# Patient Record
Sex: Female | Born: 1960 | Race: Asian | Hispanic: No | Smoking: Never smoker
Health system: Southern US, Academic
[De-identification: ages and names within clinical notes are randomized; demographics above are authoritative.]

## PROBLEM LIST (undated history)

## (undated) DIAGNOSIS — L709 Acne, unspecified: Secondary | ICD-10-CM

## (undated) DIAGNOSIS — K3580 Unspecified acute appendicitis: Secondary | ICD-10-CM

## (undated) DIAGNOSIS — T8149XA Infection following a procedure, other surgical site, initial encounter: Secondary | ICD-10-CM

## (undated) DIAGNOSIS — I82409 Acute embolism and thrombosis of unspecified deep veins of unspecified lower extremity: Secondary | ICD-10-CM

## (undated) HISTORY — DX: Infection following a procedure, other surgical site, initial encounter: T81.49XA

## (undated) HISTORY — PX: ABDOMINAL HYSTERECTOMY: SHX81

## (undated) HISTORY — DX: Unspecified acute appendicitis: K35.80

## (undated) HISTORY — PX: HX BREAST BIOPSY: SHX20

---

## 2002-01-10 ENCOUNTER — Ambulatory Visit: Payer: Self-pay

## 2003-01-16 ENCOUNTER — Ambulatory Visit: Payer: Self-pay

## 2003-10-17 ENCOUNTER — Ambulatory Visit: Payer: Self-pay

## 2004-04-14 ENCOUNTER — Ambulatory Visit: Payer: Self-pay

## 2004-07-03 ENCOUNTER — Emergency Department: Payer: Self-pay | Admitting: Emergency Medicine

## 2005-04-18 ENCOUNTER — Ambulatory Visit: Payer: Self-pay

## 2006-04-20 ENCOUNTER — Ambulatory Visit: Payer: Self-pay

## 2006-04-25 ENCOUNTER — Ambulatory Visit: Payer: Self-pay

## 2006-12-18 ENCOUNTER — Ambulatory Visit: Payer: Self-pay | Admitting: Obstetrics and Gynecology

## 2007-03-13 ENCOUNTER — Ambulatory Visit: Payer: Self-pay | Admitting: Obstetrics and Gynecology

## 2007-03-19 ENCOUNTER — Inpatient Hospital Stay: Payer: Self-pay | Admitting: Obstetrics and Gynecology

## 2007-04-26 ENCOUNTER — Ambulatory Visit: Payer: Self-pay

## 2008-01-24 DIAGNOSIS — D239 Other benign neoplasm of skin, unspecified: Secondary | ICD-10-CM

## 2008-01-24 HISTORY — DX: Other benign neoplasm of skin, unspecified: D23.9

## 2008-02-21 ENCOUNTER — Ambulatory Visit: Payer: Self-pay | Admitting: Obstetrics and Gynecology

## 2008-03-03 ENCOUNTER — Emergency Department: Payer: Self-pay | Admitting: Internal Medicine

## 2008-04-07 ENCOUNTER — Ambulatory Visit: Payer: Self-pay | Admitting: Gastroenterology

## 2008-04-30 ENCOUNTER — Ambulatory Visit: Admission: RE | Admit: 2008-04-30 | Payer: Self-pay | Source: Ambulatory Visit | Admitting: EXTERNAL

## 2009-05-01 ENCOUNTER — Ambulatory Visit: Admission: RE | Admit: 2009-05-01 | Payer: Self-pay | Source: Ambulatory Visit | Admitting: EXTERNAL

## 2009-09-21 ENCOUNTER — Ambulatory Visit: Admission: RE | Admit: 2009-09-21 | Payer: Self-pay | Source: Ambulatory Visit | Admitting: EXTERNAL

## 2011-05-23 ENCOUNTER — Other Ambulatory Visit (HOSPITAL_BASED_OUTPATIENT_CLINIC_OR_DEPARTMENT_OTHER): Payer: Self-pay

## 2011-05-25 ENCOUNTER — Ambulatory Visit (HOSPITAL_BASED_OUTPATIENT_CLINIC_OR_DEPARTMENT_OTHER): Admission: RE | Admit: 2011-05-25 | Source: Ambulatory Visit

## 2011-08-10 ENCOUNTER — Ambulatory Visit: Payer: Self-pay | Admitting: Obstetrics and Gynecology

## 2011-09-02 ENCOUNTER — Ambulatory Visit (HOSPITAL_BASED_OUTPATIENT_CLINIC_OR_DEPARTMENT_OTHER)
Admission: RE | Admit: 2011-09-02 | Discharge: 2011-09-02 | Disposition: A | Discharge status: Home / Self Care | Source: Ambulatory Visit

## 2011-09-02 DIAGNOSIS — Z1231 Encounter for screening mammogram for malignant neoplasm of breast: Secondary | ICD-10-CM | POA: Insufficient documentation

## 2012-07-10 ENCOUNTER — Other Ambulatory Visit (HOSPITAL_BASED_OUTPATIENT_CLINIC_OR_DEPARTMENT_OTHER): Payer: Self-pay

## 2012-09-06 ENCOUNTER — Ambulatory Visit (HOSPITAL_BASED_OUTPATIENT_CLINIC_OR_DEPARTMENT_OTHER)
Admission: RE | Admit: 2012-09-06 | Discharge: 2012-09-06 | Disposition: A | Discharge status: Home / Self Care | Source: Ambulatory Visit

## 2012-09-06 DIAGNOSIS — Z1231 Encounter for screening mammogram for malignant neoplasm of breast: Secondary | ICD-10-CM | POA: Insufficient documentation

## 2012-10-10 ENCOUNTER — Other Ambulatory Visit (HOSPITAL_BASED_OUTPATIENT_CLINIC_OR_DEPARTMENT_OTHER): Payer: Self-pay | Admitting: OTOLARYNGOLOGY

## 2012-10-25 ENCOUNTER — Ambulatory Visit (HOSPITAL_BASED_OUTPATIENT_CLINIC_OR_DEPARTMENT_OTHER)
Admission: RE | Admit: 2012-10-25 | Discharge: 2012-10-25 | Disposition: A | Discharge status: Home / Self Care | Source: Ambulatory Visit | Attending: OTOLARYNGOLOGY | Admitting: OTOLARYNGOLOGY

## 2012-10-25 DIAGNOSIS — H905 Unspecified sensorineural hearing loss: Secondary | ICD-10-CM | POA: Insufficient documentation

## 2012-10-25 MED ADMIN — iopamidoL 76 % intravenous solution: 60 mL | INTRAVENOUS | NDC 00270131635

## 2012-10-25 MED FILL — iopamidoL 370 mg iodine/mL (76 %) intravenous solution: 100.0000 mL | INTRAVENOUS | Qty: 100 | Status: AC

## 2013-09-05 ENCOUNTER — Other Ambulatory Visit (HOSPITAL_BASED_OUTPATIENT_CLINIC_OR_DEPARTMENT_OTHER): Payer: Self-pay

## 2013-09-05 ENCOUNTER — Other Ambulatory Visit (HOSPITAL_COMMUNITY): Payer: Self-pay

## 2013-09-05 DIAGNOSIS — Z1231 Encounter for screening mammogram for malignant neoplasm of breast: Secondary | ICD-10-CM

## 2013-09-24 ENCOUNTER — Ambulatory Visit (HOSPITAL_BASED_OUTPATIENT_CLINIC_OR_DEPARTMENT_OTHER)
Admission: RE | Admit: 2013-09-24 | Discharge: 2013-09-24 | Disposition: A | Discharge status: Home / Self Care | Source: Ambulatory Visit

## 2013-10-09 ENCOUNTER — Ambulatory Visit (HOSPITAL_BASED_OUTPATIENT_CLINIC_OR_DEPARTMENT_OTHER)
Admission: RE | Admit: 2013-10-09 | Discharge: 2013-10-09 | Disposition: A | Discharge status: Home / Self Care | Source: Ambulatory Visit

## 2013-10-09 DIAGNOSIS — Z1231 Encounter for screening mammogram for malignant neoplasm of breast: Secondary | ICD-10-CM | POA: Insufficient documentation

## 2014-07-04 DIAGNOSIS — L709 Acne, unspecified: Secondary | ICD-10-CM | POA: Insufficient documentation

## 2014-09-10 ENCOUNTER — Encounter (HOSPITAL_BASED_OUTPATIENT_CLINIC_OR_DEPARTMENT_OTHER): Payer: Self-pay

## 2015-01-26 DIAGNOSIS — Z86718 Personal history of other venous thrombosis and embolism: Secondary | ICD-10-CM | POA: Insufficient documentation

## 2015-06-04 ENCOUNTER — Other Ambulatory Visit (HOSPITAL_COMMUNITY): Payer: Self-pay

## 2015-06-04 ENCOUNTER — Other Ambulatory Visit (HOSPITAL_BASED_OUTPATIENT_CLINIC_OR_DEPARTMENT_OTHER): Payer: Self-pay

## 2015-06-04 DIAGNOSIS — Z1231 Encounter for screening mammogram for malignant neoplasm of breast: Secondary | ICD-10-CM

## 2015-06-15 ENCOUNTER — Other Ambulatory Visit (HOSPITAL_BASED_OUTPATIENT_CLINIC_OR_DEPARTMENT_OTHER): Payer: Self-pay

## 2015-06-15 ENCOUNTER — Encounter (HOSPITAL_BASED_OUTPATIENT_CLINIC_OR_DEPARTMENT_OTHER): Payer: Self-pay

## 2015-06-15 ENCOUNTER — Ambulatory Visit (HOSPITAL_BASED_OUTPATIENT_CLINIC_OR_DEPARTMENT_OTHER)
Admission: RE | Admit: 2015-06-15 | Discharge: 2015-06-15 | Disposition: A | Discharge status: Home / Self Care | Source: Ambulatory Visit

## 2015-06-15 DIAGNOSIS — Z1231 Encounter for screening mammogram for malignant neoplasm of breast: Secondary | ICD-10-CM | POA: Insufficient documentation

## 2015-06-16 ENCOUNTER — Ambulatory Visit (HOSPITAL_BASED_OUTPATIENT_CLINIC_OR_DEPARTMENT_OTHER)

## 2015-06-16 DIAGNOSIS — Z0000 Encounter for general adult medical examination without abnormal findings: Secondary | ICD-10-CM | POA: Insufficient documentation

## 2015-06-17 ENCOUNTER — Ambulatory Visit (HOSPITAL_BASED_OUTPATIENT_CLINIC_OR_DEPARTMENT_OTHER)

## 2015-06-17 LAB — COMPREHENSIVE METABOLIC PROFILE - BMC/JMC ONLY
ALBUMIN/GLOBULIN RATIO: 1.8 (ref 0.8–2.0)
ALBUMIN: 4.6 g/dL (ref 3.5–5.0)
ALKALINE PHOSPHATASE: 61 U/L (ref 38–126)
ALT (SGPT): 15 U/L (ref 14–54)
ANION GAP: 10 mmol/L (ref 3–11)
AST (SGOT): 27 U/L (ref 15–41)
BILIRUBIN TOTAL: 0.9 mg/dL (ref 0.3–1.2)
BUN/CREA RATIO: 13 (ref 6–22)
BUN: 10 mg/dL (ref 6–20)
CALCIUM: 9.6 mg/dL (ref 8.6–10.3)
CHLORIDE: 105 mmol/L (ref 101–111)
CO2 TOTAL: 25 mmol/L (ref 22–32)
CREATININE: 0.75 mg/dL (ref 0.44–1.00)
ESTIMATED GFR: >60 mL/min/1.73mˆ2 (ref >60)
GLUCOSE: 93 mg/dL (ref 70–110)
POTASSIUM: 4.2 mmol/L (ref 3.4–5.1)
PROTEIN TOTAL: 7.1 g/dL (ref 6.4–8.3)
SODIUM: 140 mmol/L (ref 136–145)

## 2015-06-17 LAB — CBC W/AUTO DIFF
BASOPHIL #: 0.1 x10ˆ3/uL (ref 0.00–0.10)
BASOPHIL %: 1 % (ref 0–3)
EOSINOPHIL #: 0.2 x10ˆ3/uL (ref 0.00–0.50)
EOSINOPHIL %: 4 % (ref 0–5)
HCT: 40.3 % (ref 36.0–45.0)
HGB: 13.3 g/dL (ref 12.0–15.5)
LYMPHOCYTE #: 2 x10ˆ3/uL (ref 1.00–4.80)
LYMPHOCYTE %: 37 % (ref 15–43)
MCH: 29.5 pg (ref 27.5–33.2)
MCHC: 33 g/dL (ref 32.0–36.0)
MCV: 89.4 fL (ref 82.0–97.0)
MONOCYTE #: 0.3 x10ˆ3/uL (ref 0.20–0.90)
MONOCYTE %: 6 % (ref 5–12)
MPV: 7.4 fL (ref 7.4–10.5)
NEUTROPHIL #: 2.8 x10ˆ3/uL (ref 1.50–6.50)
NEUTROPHIL %: 52 % (ref 43–76)
PLATELETS: 314 x10ˆ3/uL (ref 150–450)
RBC: 4.51 x10ˆ6/uL (ref 4.00–5.10)
RDW: 12.7 % (ref 11.0–16.0)
WBC: 5.4 x10ˆ3/uL (ref 4.0–11.0)

## 2015-06-17 LAB — LIPID PANEL
CHOL/HDL RATIO: 2.6
CHOLESTEROL: 213 mg/dL — ABNORMAL HIGH (ref 120–199)
LDL CALC: 107 mg/dL (ref <=130)
TRIGLYCERIDES: 113 mg/dL (ref 35–135)
VLDL CALC: 23 mg/dL (ref 5–35)

## 2015-06-29 DIAGNOSIS — D229 Melanocytic nevi, unspecified: Secondary | ICD-10-CM

## 2015-06-29 HISTORY — DX: Melanocytic nevi, unspecified: D22.9

## 2016-01-02 ENCOUNTER — Other Ambulatory Visit: Payer: Self-pay

## 2016-06-30 ENCOUNTER — Other Ambulatory Visit (HOSPITAL_BASED_OUTPATIENT_CLINIC_OR_DEPARTMENT_OTHER): Payer: Self-pay

## 2016-06-30 ENCOUNTER — Other Ambulatory Visit (HOSPITAL_COMMUNITY): Payer: Self-pay

## 2016-06-30 DIAGNOSIS — Z1231 Encounter for screening mammogram for malignant neoplasm of breast: Secondary | ICD-10-CM

## 2016-07-06 ENCOUNTER — Ambulatory Visit (HOSPITAL_BASED_OUTPATIENT_CLINIC_OR_DEPARTMENT_OTHER)
Admission: RE | Admit: 2016-07-06 | Discharge: 2016-07-06 | Disposition: A | Discharge status: Home / Self Care | Source: Ambulatory Visit

## 2016-07-06 ENCOUNTER — Encounter (HOSPITAL_BASED_OUTPATIENT_CLINIC_OR_DEPARTMENT_OTHER): Payer: Self-pay

## 2016-07-06 DIAGNOSIS — Z1231 Encounter for screening mammogram for malignant neoplasm of breast: Secondary | ICD-10-CM

## 2016-07-06 DIAGNOSIS — R921 Mammographic calcification found on diagnostic imaging of breast: Secondary | ICD-10-CM | POA: Insufficient documentation

## 2016-07-07 ENCOUNTER — Ambulatory Visit (HOSPITAL_BASED_OUTPATIENT_CLINIC_OR_DEPARTMENT_OTHER)

## 2016-09-21 DIAGNOSIS — C4492 Squamous cell carcinoma of skin, unspecified: Secondary | ICD-10-CM

## 2016-09-21 HISTORY — DX: Squamous cell carcinoma of skin, unspecified: C44.92

## 2017-01-07 ENCOUNTER — Other Ambulatory Visit: Payer: Self-pay

## 2017-06-13 ENCOUNTER — Other Ambulatory Visit: Payer: Self-pay

## 2017-06-13 ENCOUNTER — Encounter: Payer: Self-pay | Admitting: Emergency Medicine

## 2017-06-13 ENCOUNTER — Emergency Department: Payer: 59

## 2017-06-13 ENCOUNTER — Emergency Department
Admission: EM | Admit: 2017-06-13 | Discharge: 2017-06-14 | Disposition: A | Discharge status: Home / Self Care | Payer: 59 | Source: Home / Self Care | Attending: Student in an Organized Health Care Education/Training Program | Admitting: Student in an Organized Health Care Education/Training Program

## 2017-06-13 DIAGNOSIS — Z79899 Other long term (current) drug therapy: Secondary | ICD-10-CM | POA: Diagnosis not present

## 2017-06-13 DIAGNOSIS — Z888 Allergy status to other drugs, medicaments and biological substances status: Secondary | ICD-10-CM | POA: Insufficient documentation

## 2017-06-13 DIAGNOSIS — K529 Noninfective gastroenteritis and colitis, unspecified: Secondary | ICD-10-CM

## 2017-06-13 DIAGNOSIS — K3580 Unspecified acute appendicitis: Principal | ICD-10-CM | POA: Insufficient documentation

## 2017-06-13 DIAGNOSIS — R112 Nausea with vomiting, unspecified: Secondary | ICD-10-CM

## 2017-06-13 DIAGNOSIS — R1084 Generalized abdominal pain: Secondary | ICD-10-CM

## 2017-06-13 LAB — COMPREHENSIVE METABOLIC PANEL
ALT: 16 U/L (ref 14–54)
AST: 19 U/L (ref 15–41)
Albumin: 4.1 g/dL (ref 3.5–5.0)
Alkaline Phosphatase: 59 U/L (ref 38–126)
Anion gap: 11 (ref 5–15)
BUN: 18 mg/dL (ref 6–20)
CHLORIDE: 104 mmol/L (ref 101–111)
CO2: 22 mmol/L (ref 22–32)
CREATININE: 0.94 mg/dL (ref 0.44–1.00)
Calcium: 9.6 mg/dL (ref 8.9–10.3)
Glucose, Bld: 107 mg/dL — ABNORMAL HIGH (ref 65–99)
POTASSIUM: 4.2 mmol/L (ref 3.5–5.1)
Sodium: 137 mmol/L (ref 135–145)
Total Bilirubin: 0.4 mg/dL (ref 0.3–1.2)
Total Protein: 7.2 g/dL (ref 6.5–8.1)

## 2017-06-13 LAB — LACTIC ACID, PLASMA: Lactic Acid, Venous: 1 mmol/L (ref 0.5–1.9)

## 2017-06-13 LAB — CBC
HCT: 39 % (ref 35.0–47.0)
Hemoglobin: 13.2 g/dL (ref 12.0–16.0)
MCH: 32 pg (ref 26.0–34.0)
MCHC: 34 g/dL (ref 32.0–36.0)
MCV: 94.3 fL (ref 80.0–100.0)
PLATELETS: 381 10*3/uL (ref 150–440)
RBC: 4.13 MIL/uL (ref 3.80–5.20)
RDW: 12.8 % (ref 11.5–14.5)
WBC: 16.4 10*3/uL — AB (ref 3.6–11.0)

## 2017-06-13 LAB — TROPONIN I: Troponin I: <0.03 ng/mL (ref <0.03)

## 2017-06-13 LAB — LIPASE, BLOOD: LIPASE: 28 U/L (ref 11–51)

## 2017-06-13 MED ORDER — OXYCODONE-ACETAMINOPHEN 5-325 MG PO TABS
1.0000 | ORAL_TABLET | Freq: Once | ORAL | Status: AC
  Administered 2017-06-13: 1 via ORAL

## 2017-06-13 MED ORDER — METOCLOPRAMIDE HCL 5 MG/ML IJ SOLN
10.0000 mg | Freq: Once | INTRAMUSCULAR | Status: AC
  Administered 2017-06-13: 10 mg via INTRAVENOUS
  Filled 2017-06-13: qty 2

## 2017-06-13 MED ORDER — KETOROLAC TROMETHAMINE 30 MG/ML IJ SOLN
15.0000 mg | Freq: Once | INTRAMUSCULAR | Status: AC
  Administered 2017-06-14: 15 mg via INTRAVENOUS
  Filled 2017-06-13: qty 1

## 2017-06-13 MED ORDER — OXYCODONE-ACETAMINOPHEN 5-325 MG PO TABS
ORAL_TABLET | ORAL | Status: AC
  Filled 2017-06-13: qty 1

## 2017-06-13 MED ORDER — FENTANYL CITRATE (PF) 100 MCG/2ML IJ SOLN
100.0000 ug | INTRAMUSCULAR | Status: DC | PRN
  Administered 2017-06-13 (×2): 100 ug via INTRAVENOUS
  Filled 2017-06-13 (×2): qty 2

## 2017-06-13 MED ORDER — ONDANSETRON 4 MG PO TBDP
ORAL_TABLET | ORAL | Status: AC
  Filled 2017-06-13: qty 1

## 2017-06-13 MED ORDER — SODIUM CHLORIDE 0.9 % IV BOLUS
1000.0000 mL | Freq: Once | INTRAVENOUS | Status: AC
  Administered 2017-06-13: 1000 mL via INTRAVENOUS

## 2017-06-13 MED ORDER — ONDANSETRON 4 MG PO TBDP
4.0000 mg | ORAL_TABLET | Freq: Once | ORAL | Status: AC
  Administered 2017-06-13: 4 mg via ORAL

## 2017-06-13 MED ORDER — MORPHINE SULFATE (PF) 4 MG/ML IV SOLN
4.0000 mg | INTRAVENOUS | Status: DC | PRN
  Administered 2017-06-13: 4 mg via INTRAVENOUS
  Filled 2017-06-13: qty 1

## 2017-06-13 MED ORDER — IOPAMIDOL (ISOVUE-370) INJECTION 76%
100.0000 mL | Freq: Once | INTRAVENOUS | Status: AC | PRN
  Administered 2017-06-13: 100 mL via INTRAVENOUS

## 2017-06-13 NOTE — ED Triage Notes (Signed)
Pt to triage via w/c appears uncomfortable, st mid abd pain x hour accomp by N/V; denies hx of same

## 2017-06-13 NOTE — ED Provider Notes (Signed)
[facility] Emergency Department Provider Note    First MD Initiated Contact with Patient 06/13/17 2209     (approximate)  I have reviewed the triage vital signs and the nursing notes.   HISTORY  Chief Complaint Abdominal Pain    HPI [name] is a 57 y.o. female history of abdominal hysterectomy but no other past medical history presents with severe mid abdominal pain with nausea and vomiting.  Denies any hematemesis.  States she never had pain like this before.  No pain ripping or tearing through to her back.  Patient screaming in pain.  No history of kidney stones.  No pain shooting down her legs.  No numbness or tingling.  No measured fevers.  States that she was cooking dinner when symptoms started.  History reviewed. No pertinent past medical history. No family history on file. Past Surgical History:  Procedure Laterality Date  . ABDOMINAL HYSTERECTOMY    . CESAREAN SECTION     There are no active problems to display for this patient.     Prior to Admission medications   Medication Sig Start Date End Date Taking? Authorizing Provider  metoCLOPramide (REGLAN) 10 MG tablet Take 1 tablet (10 mg total) by mouth every 6 (six) hours as needed for nausea. 06/14/17 06/14/18  Merlyn Lot, MD    Allergies Phenergan [promethazine hcl]    Social History Social History   Tobacco Use  . Smoking status: Never Smoker  . Smokeless tobacco: Never Used  Substance Use Topics  . Alcohol use: Not on file  . Drug use: Not on file    Review of Systems Patient denies headaches, rhinorrhea, blurry vision, numbness, shortness of breath, chest pain, edema, cough, abdominal pain, nausea, vomiting, diarrhea, dysuria, fevers, rashes or hallucinations unless otherwise stated above in HPI. ____________________________________________   PHYSICAL EXAM:  VITAL SIGNS: Vitals:   06/13/17 2245 06/13/17 2342  BP: 136/81 111/61  Pulse: 70 77  Resp: 12 19   Temp:    SpO2: 97% 98%    Constitutional: Alert and oriented. Very uncomfortable, dry heaving in the er Eyes: Conjunctivae are normal.  Head: Atraumatic. Nose: No congestion/rhinnorhea. Mouth/Throat: Mucous membranes are moist.   Neck: No stridor. Painless ROM.  Cardiovascular: Normal rate, regular rhythm. Grossly normal heart sounds.  Good peripheral circulation. Respiratory: Normal respiratory effort.  No retractions. Lungs CTAB. Gastrointestinal: Soft with diffuse ttp but no focal peritonitis. No distention.  Genitourinary:  Musculoskeletal: No lower extremity tenderness nor edema.  No joint effusions. Neurologic:  No gross focal neurologic deficits are appreciated. No facial droop Skin:  Skin is warm, dry and intact. No rash noted. ________________________   LABS (all labs ordered are listed, but only abnormal results are displayed)  Results for orders placed or performed during the hospital encounter of 06/13/17 (from the past 24 hour(s))  Lipase, blood     Status: None   Collection Time: 06/13/17  8:49 PM  Result Value Ref Range   Lipase 28 11 - 51 U/L  Comprehensive metabolic panel     Status: Abnormal   Collection Time: 06/13/17  8:49 PM  Result Value Ref Range   Sodium 137 135 - 145 mmol/L   Potassium 4.2 3.5 - 5.1 mmol/L   Chloride 104 101 - 111 mmol/L   CO2 22 22 - 32 mmol/L   Glucose, Bld 107 (H) 65 - 99 mg/dL   BUN 18 6 - 20 mg/dL   Creatinine, Ser 0.94 0.44 - 1.00 mg/dL  Calcium 9.6 8.9 - 10.3 mg/dL   Total Protein 7.2 6.5 - 8.1 g/dL   Albumin 4.1 3.5 - 5.0 g/dL   AST 19 15 - 41 U/L   ALT 16 14 - 54 U/L   Alkaline Phosphatase 59 38 - 126 U/L   Total Bilirubin 0.4 0.3 - 1.2 mg/dL   GFR calc non Af Amer >60 >60 mL/min   GFR calc Af Amer >60 >60 mL/min   Anion gap 11 5 - 15  CBC     Status: Abnormal   Collection Time: 06/13/17  8:49 PM  Result Value Ref Range   WBC 16.4 (H) 3.6 - 11.0 K/uL   RBC 4.13 3.80 - 5.20 MIL/uL   Hemoglobin 13.2 12.0 - 16.0  g/dL   HCT 39.0 35.0 - 47.0 %   MCV 94.3 80.0 - 100.0 fL   MCH 32.0 26.0 - 34.0 pg   MCHC 34.0 32.0 - 36.0 g/dL   RDW 12.8 11.5 - 14.5 %   Platelets 381 150 - 440 K/uL  Troponin I     Status: None   Collection Time: 06/13/17  8:49 PM  Result Value Ref Range   Troponin I <0.03 <0.03 ng/mL  Lactic acid, plasma     Status: None   Collection Time: 06/13/17 10:38 PM  Result Value Ref Range   Lactic Acid, Venous 1.0 0.5 - 1.9 mmol/L   ____________________________________________  EKG My review and personal interpretation at Time: 20:51   Indication: N/V  Rate: 70  Rhythm: sinus Axis: normal Other: normal  intervals, no stemi, nonspecific st abn ____________________________________________  RADIOLOGY  I personally reviewed all radiographic images ordered to evaluate for the above acute complaints and reviewed radiology reports and findings.  These findings were personally discussed with the patient.  Please see medical record for radiology report.  ____________________________________________   PROCEDURES  Procedure(s) performed:  Procedures    Critical Care performed: no ____________________________________________   INITIAL IMPRESSION / ASSESSMENT AND PLAN / ED COURSE  Pertinent labs & imaging results that were available during my care of the patient were reviewed by me and considered in my medical decision making (see chart for details).  DDX: sbo, perforation, AAA, dissection, mi, colitis, stone, uti, appy  [name] is a 57 y.o. who presents to the ED with severe abdominal pain as described above.  Patient does appear critically ill retching and pain.  Vital signs are stable she is afebrile but based on her acuity of pain CT imaging will be ordered to exclude dissection, aneurysm, mesenteric ischemia or perforation.  Patient will receive IV pain medication, IV antiemetics and IV fluids.  Clinical Course as of Jun 14 8  Tue Jun 13, 2017  2358 CT imaging  fortunately shows no evidence of acute intra-abdominal process requiring surgical intervention.  No evidence of obstructive process.  No perforation aneurysm or dissection.  Lactate is normal.  Possible infectious colitis or enteritis.  Still awaiting urinalysis.  Discussed observation in the hospital for pain control versus discharge home based on the patient's response to therapies in the next hour or so.  Patient will be signed out to oncoming physician.  Have discussed with the patient and available family all diagnostics and treatments performed thus far and all questions were answered to the best of my ability. The patient demonstrates understanding and agreement with plan.    [PR]    Clinical Course User Index [PR] Merlyn Lot, MD     As part of  my medical decision making, I reviewed the following data within the [facility] notes reviewed and incorporated, Labs reviewed, notes from prior ED visits and [place] Controlled Substance Database   ____________________________________________   FINAL CLINICAL IMPRESSION(S) / ED DIAGNOSES  Final diagnoses:  Generalized abdominal pain  Nausea and vomiting, intractability of vomiting not specified, unspecified vomiting type      NEW MEDICATIONS STARTED DURING THIS VISIT:  New Prescriptions   METOCLOPRAMIDE (REGLAN) 10 MG TABLET    Take 1 tablet (10 mg total) by mouth every 6 (six) hours as needed for nausea.     Note:  This document was prepared using Dragon voice recognition software and may include unintentional dictation errors.    Merlyn Lot, MD 06/14/17 Berniece Salines

## 2017-06-13 NOTE — ED Notes (Signed)
Patient transported to CT 

## 2017-06-14 ENCOUNTER — Emergency Department: Payer: 59

## 2017-06-14 ENCOUNTER — Observation Stay
Admission: EM | Admit: 2017-06-14 | Discharge: 2017-06-16 | Disposition: A | Discharge status: Home / Self Care | Payer: 59 | Attending: Surgery | Admitting: Surgery

## 2017-06-14 ENCOUNTER — Encounter: Payer: Self-pay | Admitting: Emergency Medicine

## 2017-06-14 ENCOUNTER — Emergency Department: Payer: 59 | Admitting: Anesthesiology

## 2017-06-14 ENCOUNTER — Other Ambulatory Visit: Payer: Self-pay

## 2017-06-14 ENCOUNTER — Encounter
Admission: EM | Disposition: A | Discharge status: Home / Self Care | Payer: Self-pay | Source: Home / Self Care | Attending: Emergency Medicine

## 2017-06-14 DIAGNOSIS — K3580 Unspecified acute appendicitis: Principal | ICD-10-CM | POA: Diagnosis present

## 2017-06-14 HISTORY — PX: LAPAROSCOPIC APPENDECTOMY: SHX408

## 2017-06-14 LAB — COMPREHENSIVE METABOLIC PANEL
ALK PHOS: 55 U/L (ref 38–126)
ALT: 16 U/L (ref 14–54)
ANION GAP: 7 (ref 5–15)
AST: 19 U/L (ref 15–41)
Albumin: 3.7 g/dL (ref 3.5–5.0)
BILIRUBIN TOTAL: 0.9 mg/dL (ref 0.3–1.2)
BUN: 16 mg/dL (ref 6–20)
CALCIUM: 9.3 mg/dL (ref 8.9–10.3)
CO2: 24 mmol/L (ref 22–32)
Chloride: 106 mmol/L (ref 101–111)
Creatinine, Ser: 0.97 mg/dL (ref 0.44–1.00)
GFR calc non Af Amer: >60 mL/min (ref >60)
Glucose, Bld: 123 mg/dL — ABNORMAL HIGH (ref 65–99)
Potassium: 4.2 mmol/L (ref 3.5–5.1)
SODIUM: 137 mmol/L (ref 135–145)
TOTAL PROTEIN: 6.6 g/dL (ref 6.5–8.1)

## 2017-06-14 LAB — CBC WITH DIFFERENTIAL/PLATELET
BASOS ABS: 0 10*3/uL (ref 0–0.1)
BASOS PCT: 0 %
Eosinophils Absolute: 0 10*3/uL (ref 0–0.7)
Eosinophils Relative: 0 %
HCT: 37.4 % (ref 35.0–47.0)
Hemoglobin: 12.4 g/dL (ref 12.0–16.0)
Lymphocytes Relative: 10 %
Lymphs Abs: 2.3 10*3/uL (ref 1.0–3.6)
MCH: 31.5 pg (ref 26.0–34.0)
MCHC: 33.2 g/dL (ref 32.0–36.0)
MCV: 94.9 fL (ref 80.0–100.0)
Monocytes Absolute: 1.2 10*3/uL — ABNORMAL HIGH (ref 0.2–0.9)
Monocytes Relative: 5 %
NEUTROS ABS: 19.8 10*3/uL — AB (ref 1.4–6.5)
NEUTROS PCT: 85 %
Platelets: 350 10*3/uL (ref 150–440)
RBC: 3.94 MIL/uL (ref 3.80–5.20)
RDW: 12.9 % (ref 11.5–14.5)
WBC: 23.4 10*3/uL — ABNORMAL HIGH (ref 3.6–11.0)

## 2017-06-14 LAB — ETHANOL: Alcohol, Ethyl (B): <10 mg/dL (ref <10)

## 2017-06-14 LAB — TROPONIN I: Troponin I: <0.03 ng/mL (ref <0.03)

## 2017-06-14 LAB — LACTIC ACID, PLASMA: Lactic Acid, Venous: 1.3 mmol/L (ref 0.5–1.9)

## 2017-06-14 LAB — LIPASE, BLOOD: Lipase: 21 U/L (ref 11–51)

## 2017-06-14 SURGERY — APPENDECTOMY, LAPAROSCOPIC
Anesthesia: General | Site: Abdomen | Wound class: Clean Contaminated

## 2017-06-14 MED ORDER — ONDANSETRON HCL 4 MG/2ML IJ SOLN
INTRAMUSCULAR | Status: AC
  Filled 2017-06-14: qty 2

## 2017-06-14 MED ORDER — LIDOCAINE HCL (CARDIAC) PF 100 MG/5ML IV SOSY
PREFILLED_SYRINGE | INTRAVENOUS | Status: DC | PRN
  Administered 2017-06-14: 60 mg via INTRAVENOUS

## 2017-06-14 MED ORDER — MORPHINE SULFATE (PF) 2 MG/ML IV SOLN
2.0000 mg | INTRAVENOUS | Status: DC | PRN
  Administered 2017-06-14 – 2017-06-15 (×2): 2 mg via INTRAVENOUS
  Filled 2017-06-14 (×3): qty 1

## 2017-06-14 MED ORDER — PIPERACILLIN-TAZOBACTAM 3.375 G IVPB 30 MIN
3.3750 g | Freq: Once | INTRAVENOUS | Status: AC
  Administered 2017-06-14: 3.375 g via INTRAVENOUS
  Filled 2017-06-14: qty 50

## 2017-06-14 MED ORDER — LACTATED RINGERS IV SOLN
INTRAVENOUS | Status: DC
  Administered 2017-06-14: 13:00:00 via INTRAVENOUS

## 2017-06-14 MED ORDER — SUGAMMADEX SODIUM 200 MG/2ML IV SOLN
INTRAVENOUS | Status: AC
  Filled 2017-06-14: qty 2

## 2017-06-14 MED ORDER — ONDANSETRON HCL 4 MG/2ML IJ SOLN: 4.0000 mg | Freq: Once | INTRAMUSCULAR | Status: DC | PRN

## 2017-06-14 MED ORDER — PROPOFOL 10 MG/ML IV BOLUS
INTRAVENOUS | Status: DC | PRN
  Administered 2017-06-14: 100 mg via INTRAVENOUS

## 2017-06-14 MED ORDER — ACETAMINOPHEN 325 MG PO TABS
650.0000 mg | ORAL_TABLET | Freq: Once | ORAL | Status: AC
  Administered 2017-06-14: 650 mg via ORAL

## 2017-06-14 MED ORDER — DEXAMETHASONE SODIUM PHOSPHATE 10 MG/ML IJ SOLN
INTRAMUSCULAR | Status: DC | PRN
  Administered 2017-06-14: 8 mg via INTRAVENOUS

## 2017-06-14 MED ORDER — MIDAZOLAM HCL 2 MG/2ML IJ SOLN
INTRAMUSCULAR | Status: AC
  Filled 2017-06-14: qty 2

## 2017-06-14 MED ORDER — MIDAZOLAM HCL 2 MG/2ML IJ SOLN
INTRAMUSCULAR | Status: DC | PRN
  Administered 2017-06-14: 2 mg via INTRAVENOUS

## 2017-06-14 MED ORDER — HYDROMORPHONE HCL 1 MG/ML IJ SOLN
1.0000 mg | Freq: Once | INTRAMUSCULAR | Status: AC
  Administered 2017-06-14: 1 mg via INTRAVENOUS
  Filled 2017-06-14: qty 1

## 2017-06-14 MED ORDER — ROCURONIUM BROMIDE 50 MG/5ML IV SOLN
INTRAVENOUS | Status: AC
  Filled 2017-06-14: qty 1

## 2017-06-14 MED ORDER — HYDROMORPHONE HCL 1 MG/ML IJ SOLN
0.5000 mg | Freq: Once | INTRAMUSCULAR | Status: AC
  Administered 2017-06-14: 0.5 mg via INTRAVENOUS
  Filled 2017-06-14: qty 1

## 2017-06-14 MED ORDER — METOCLOPRAMIDE HCL 10 MG PO TABS: 10.0000 mg | ORAL_TABLET | Freq: Four times a day (QID) | ORAL | 0 refills | Status: DC | PRN

## 2017-06-14 MED ORDER — IOPAMIDOL (ISOVUE-300) INJECTION 61%
30.0000 mL | Freq: Once | INTRAVENOUS | Status: AC | PRN
  Administered 2017-06-14: 30 mL via ORAL

## 2017-06-14 MED ORDER — DEXAMETHASONE SODIUM PHOSPHATE 10 MG/ML IJ SOLN
INTRAMUSCULAR | Status: AC
  Filled 2017-06-14: qty 1

## 2017-06-14 MED ORDER — SODIUM CHLORIDE 0.9 % IV BOLUS
1000.0000 mL | Freq: Once | INTRAVENOUS | Status: AC
  Administered 2017-06-14: 1000 mL via INTRAVENOUS

## 2017-06-14 MED ORDER — ONDANSETRON HCL 4 MG/2ML IJ SOLN
4.0000 mg | Freq: Once | INTRAMUSCULAR | Status: AC
  Administered 2017-06-14: 4 mg via INTRAVENOUS
  Filled 2017-06-14: qty 2

## 2017-06-14 MED ORDER — FENTANYL CITRATE (PF) 100 MCG/2ML IJ SOLN: 25.0000 ug | INTRAMUSCULAR | Status: DC | PRN

## 2017-06-14 MED ORDER — BUPIVACAINE-EPINEPHRINE (PF) 0.25% -1:200000 IJ SOLN
INTRAMUSCULAR | Status: DC | PRN
  Administered 2017-06-14: 24 mL via PERINEURAL

## 2017-06-14 MED ORDER — PIPERACILLIN-TAZOBACTAM 3.375 G IVPB 30 MIN
3.3750 g | Freq: Three times a day (TID) | INTRAVENOUS | Status: DC
  Filled 2017-06-14 (×3): qty 50

## 2017-06-14 MED ORDER — HYDROCODONE-ACETAMINOPHEN 5-325 MG PO TABS
1.0000 | ORAL_TABLET | ORAL | Status: DC | PRN
  Administered 2017-06-14 – 2017-06-15 (×2): 1 via ORAL
  Administered 2017-06-15: 2 via ORAL
  Administered 2017-06-15: 1 via ORAL
  Filled 2017-06-14 (×3): qty 1
  Filled 2017-06-14: qty 2

## 2017-06-14 MED ORDER — SUCCINYLCHOLINE CHLORIDE 20 MG/ML IJ SOLN
INTRAMUSCULAR | Status: DC | PRN
  Administered 2017-06-14: 80 mg via INTRAVENOUS

## 2017-06-14 MED ORDER — FENTANYL CITRATE (PF) 100 MCG/2ML IJ SOLN
INTRAMUSCULAR | Status: DC | PRN
  Administered 2017-06-14: 25 ug via INTRAVENOUS
  Administered 2017-06-14: 50 ug via INTRAVENOUS

## 2017-06-14 MED ORDER — ACETAMINOPHEN 325 MG PO TABS
ORAL_TABLET | ORAL | Status: AC
  Administered 2017-06-14: 650 mg via ORAL
  Filled 2017-06-14: qty 2

## 2017-06-14 MED ORDER — ONDANSETRON HCL 4 MG/2ML IJ SOLN
INTRAMUSCULAR | Status: DC | PRN
  Administered 2017-06-14: 4 mg via INTRAVENOUS

## 2017-06-14 MED ORDER — DEXTROSE IN LACTATED RINGERS 5 % IV SOLN
INTRAVENOUS | Status: DC
  Administered 2017-06-14 – 2017-06-16 (×6): via INTRAVENOUS

## 2017-06-14 MED ORDER — PIPERACILLIN-TAZOBACTAM 3.375 G IVPB
3.3750 g | Freq: Three times a day (TID) | INTRAVENOUS | Status: DC
  Administered 2017-06-14 – 2017-06-16 (×5): 3.375 g via INTRAVENOUS
  Filled 2017-06-14 (×5): qty 50

## 2017-06-14 MED ORDER — SUGAMMADEX SODIUM 200 MG/2ML IV SOLN
INTRAVENOUS | Status: DC | PRN
  Administered 2017-06-14: 100 mg via INTRAVENOUS

## 2017-06-14 MED ORDER — LACTATED RINGERS IV BOLUS
500.0000 mL | Freq: Once | INTRAVENOUS | Status: DC
  Filled 2017-06-14: qty 500

## 2017-06-14 MED ORDER — HEPARIN SODIUM (PORCINE) 5000 UNIT/ML IJ SOLN
5000.0000 [IU] | Freq: Three times a day (TID) | INTRAMUSCULAR | Status: DC
  Administered 2017-06-14 – 2017-06-16 (×5): 5000 [IU] via SUBCUTANEOUS
  Filled 2017-06-14 (×5): qty 1

## 2017-06-14 MED ORDER — ONDANSETRON HCL 4 MG/2ML IJ SOLN: 4.0000 mg | Freq: Four times a day (QID) | INTRAMUSCULAR | Status: DC | PRN

## 2017-06-14 MED ORDER — IOPAMIDOL (ISOVUE-370) INJECTION 76%
75.0000 mL | Freq: Once | INTRAVENOUS | Status: AC | PRN
  Administered 2017-06-14: 75 mL via INTRAVENOUS

## 2017-06-14 MED ORDER — FENTANYL CITRATE (PF) 100 MCG/2ML IJ SOLN
INTRAMUSCULAR | Status: AC
  Filled 2017-06-14: qty 2

## 2017-06-14 MED ORDER — ONDANSETRON HCL 4 MG PO TABS: 4.0000 mg | ORAL_TABLET | Freq: Four times a day (QID) | ORAL | Status: DC | PRN

## 2017-06-14 MED ORDER — ROCURONIUM BROMIDE 100 MG/10ML IV SOLN
INTRAVENOUS | Status: DC | PRN
  Administered 2017-06-14: 5 mg via INTRAVENOUS
  Administered 2017-06-14: 25 mg via INTRAVENOUS

## 2017-06-14 MED ORDER — PHENYLEPHRINE HCL 10 MG/ML IJ SOLN
INTRAMUSCULAR | Status: DC | PRN
  Administered 2017-06-14 (×2): 50 ug via INTRAVENOUS
  Administered 2017-06-14 (×2): 100 ug via INTRAVENOUS

## 2017-06-14 SURGICAL SUPPLY — 45 items
ADH LQ OCL WTPRF AMP STRL LF (MISCELLANEOUS) ×1
ADHESIVE MASTISOL STRL (MISCELLANEOUS) ×2 IMPLANT
APPLIER CLIP ROT 10 11.4 M/L (STAPLE) ×2
APR CLP MED LRG 11.4X10 (STAPLE) ×1
BAG SPEC RTRVL LRG 6X4 10 (ENDOMECHANICALS) ×1
BLADE SURG SZ11 CARB STEEL (BLADE) ×2 IMPLANT
CANISTER SUCT 3000ML PPV (MISCELLANEOUS) ×2 IMPLANT
CHLORAPREP W/TINT 26ML (MISCELLANEOUS) ×2 IMPLANT
CLIP APPLIE ROT 10 11.4 M/L (STAPLE) ×1 IMPLANT
CUTTER FLEX LINEAR 45M (STAPLE) ×2 IMPLANT
DEVICE TROCAR PUNCTURE CLOSURE (ENDOMECHANICALS) ×2 IMPLANT
ELECT REM PT RETURN 9FT ADLT (ELECTROSURGICAL) ×2
ELECTRODE REM PT RTRN 9FT ADLT (ELECTROSURGICAL) ×1 IMPLANT
GLOVE BIO SURGEON STRL SZ8 (GLOVE) ×2 IMPLANT
GOWN STRL REUS W/ TWL LRG LVL3 (GOWN DISPOSABLE) ×2 IMPLANT
GOWN STRL REUS W/TWL LRG LVL3 (GOWN DISPOSABLE) ×4
IRRIGATION STRYKERFLOW (MISCELLANEOUS) ×1 IMPLANT
IRRIGATOR STRYKERFLOW (MISCELLANEOUS) ×2
KIT TURNOVER KIT A (KITS) ×2 IMPLANT
LABEL OR SOLS (LABEL) ×2 IMPLANT
NEEDLE HYPO 22GX1.5 SAFETY (NEEDLE) ×2 IMPLANT
NEEDLE VERESS 14GA 120MM (NEEDLE) ×2 IMPLANT
NS IRRIG 500ML POUR BTL (IV SOLUTION) ×2 IMPLANT
PACK LAP CHOLECYSTECTOMY (MISCELLANEOUS) ×2 IMPLANT
POUCH SPECIMEN RETRIEVAL 10MM (ENDOMECHANICALS) ×2 IMPLANT
RELOAD 45 VASCULAR/THIN (ENDOMECHANICALS) ×2 IMPLANT
RELOAD STAPLE 45 2.5 WHT GRN (ENDOMECHANICALS) ×1 IMPLANT
RELOAD STAPLE 45 3.5 BLU ETS (ENDOMECHANICALS) ×1 IMPLANT
RELOAD STAPLE TA45 3.5 REG BLU (ENDOMECHANICALS) ×2 IMPLANT
SCISSORS METZENBAUM CVD 33 (INSTRUMENTS) IMPLANT
SLEEVE ENDOPATH XCEL 5M (ENDOMECHANICALS) ×2 IMPLANT
SOL .9 NS 3000ML IRR  AL (IV SOLUTION) ×1
SOL .9 NS 3000ML IRR AL (IV SOLUTION) ×1
SOL .9 NS 3000ML IRR UROMATIC (IV SOLUTION) ×1 IMPLANT
SPONGE GAUZE 2X2 8PLY STRL LF (GAUZE/BANDAGES/DRESSINGS) ×4 IMPLANT
SPONGE LAP 18X18 5 PK (GAUZE/BANDAGES/DRESSINGS) ×2 IMPLANT
STRIP CLOSURE SKIN 1/2X4 (GAUZE/BANDAGES/DRESSINGS) ×2 IMPLANT
SUT MNCRL 4-0 (SUTURE) ×2
SUT MNCRL 4-0 27XMFL (SUTURE) ×1
SUT VICRYL 0 TIES 12 18 (SUTURE) ×2 IMPLANT
SUTURE MNCRL 4-0 27XMF (SUTURE) ×1 IMPLANT
TRAY FOLEY W/METER SILVER 16FR (SET/KITS/TRAYS/PACK) ×2 IMPLANT
TROCAR XCEL 12X100 BLDLESS (ENDOMECHANICALS) ×2 IMPLANT
TROCAR XCEL NON-BLD 5MMX100MML (ENDOMECHANICALS) ×2 IMPLANT
TUBING INSUFFLATION (TUBING) ×2 IMPLANT

## 2017-06-14 NOTE — ED Notes (Signed)
Patient signed consent for surgery and toenail polish removed from patient's toes.

## 2017-06-14 NOTE — Anesthesia Post-op Follow-up Note (Signed)
Anesthesia QCDR form completed.        

## 2017-06-14 NOTE — ED Notes (Signed)
Orderly here to p/u pt for surgery. Pt given her phone to call husband. Bolus complete and remaining fluid set to [facility]. Pt taken on ER stretcher due to pain. No significant improvement noted in BP.

## 2017-06-14 NOTE — Op Note (Signed)
laparascopic appendectomy   Kathryn Pugh Date of operation:  06/14/2017  Indications: The patient presented with a history of  abdominal pain. Workup has revealed findings consistent with acute appendicitis.  Pre-operative Diagnosis: Acute appendicitis  Post-operative Diagnosis: Acute appendicitis, nonruptured  Surgeon: Jerrol Banana. Burt Knack, MD, FACS  Anesthesia: General with endotracheal tube  Procedure Details  The patient was seen again in the preop area. The options of surgery versus observation were reviewed with the patient and/or family. The risks of bleeding, infection, recurrence of symptoms, negative laparoscopy, potential for an open procedure, bowel injury, abscess or infection, were all reviewed as well. The patient was taken to Operating Room, identified as Kathryn Pugh and the procedure verified as laparoscopic appendectomy. A Time Out was held and the above information confirmed.  The patient was placed in the supine position and general anesthesia was induced.  Antibiotic prophylaxis was administered and VT E prophylaxis was in place. A Foley catheter was placed by the nursing staff.   The abdomen was prepped and draped in a sterile fashion. An infraumbilical incision was made. A Veress needle was placed and pneumoperitoneum was obtained. A 5 mm trocar port was placed without difficulty and the abdominal cavity was explored.  Under direct vision a 5 mm suprapubic port was placed and a 13 mm left lateral port was placed all under direct vision.  There was extensive cloudy fluid in the pelvis which was aspirated.  The appendix was identified and found to be acutely inflamed.  It was not ruptured and no other pathology was identified other than dilated bowel. The appendix was carefully dissected. The base of the appendix was dissected out and divided with a standard load Endo GIA. The mesoappendix was divided with a vascular load Endo GIA.  The appendix was passed out through the  left lateral port site with the aid of an Endo Catch bag. The right lower quadrant and pelvis was then irrigated with copious amounts of normal saline which was aspirated.  A 3 L bag of irrigation was performed and aspirated.  Inspection  failed to identify any additional bleeding and there were no signs of bowel injury. Therefore the left lateral port site was closed under direct vision utilizing an Endo Close technique with 0 Vicryl interrupted sutures, all under direct vision.   Again the right lower quadrant was inspected there was no sign of bleeding or bowel injury therefore pneumoperitoneum was released, all ports were removed and the skin incisions were approximated with subcuticular 4-0 Monocryl. Steri-Strips and Mastisol and sterile dressings were placed.  The patient tolerated the procedure well, there were no complications. The sponge lap and needle count were correct at the end of the procedure.  The patient was taken to the recovery room in stable condition to be admitted for continued care.  Findings: Acute appendicitis, nonruptured cloudy fluid in the pelvis which was aspirated and irrigated clear  Estimated Blood Loss: Minimal                  Specimens: appendix         Complications: None                  Kathryn Pugh E. Burt Knack MD, FACS

## 2017-06-14 NOTE — ED Notes (Addendum)
Given pillow.  CT staff brought contrast to room. Pt states she is unable to drink at this time. CT staff informed. Instructed that she is to drink 1st bottle then drinking second one an hour after beginning first bottle.

## 2017-06-14 NOTE — ED Notes (Addendum)
First Nurse Note:  Patient seen here last PM, returns this AM with abdominal pain that has moved lower in abdomen.  Sitting in [place].  Patient has discharge instructions for enteritis.

## 2017-06-14 NOTE — Progress Notes (Signed)
Preoperative Review   Patient is met in the preoperative holding area. The history is reviewed in the chart and with the patient. I personally reviewed the options and rationale as well as the risks of this procedure that have been previously discussed with the patient. All questions asked by the patient and/or family were answered to their satisfaction.  Patient agrees to proceed with this procedure at this time.  Tashiba Timoney E Zyaire Mccleod M.D. FACS  

## 2017-06-14 NOTE — Discharge Instructions (Signed)
It was a pleasure to take care of you today, and thank you for coming to our emergency department.  If you have any questions or concerns before leaving please ask the nurse to grab me and I'm more than happy to go through your aftercare instructions again.  If you were prescribed any opioid pain medication today such as Norco, Vicodin, Percocet, morphine, hydrocodone, or oxycodone please make sure you do not drive when you are taking this medication as it can alter your ability to drive safely.  If you have any concerns once you are home that you are not improving or are in fact getting worse before you can make it to your follow-up appointment, please do not hesitate to call 911 and come back for further evaluation.  Results for orders placed or performed during the hospital encounter of 06/13/17  Lipase, blood  Result Value Ref Range   Lipase 28 11 - 51 U/L  Comprehensive metabolic panel  Result Value Ref Range   Sodium 137 135 - 145 mmol/L   Potassium 4.2 3.5 - 5.1 mmol/L   Chloride 104 101 - 111 mmol/L   CO2 22 22 - 32 mmol/L   Glucose, Bld 107 (H) 65 - 99 mg/dL   BUN 18 6 - 20 mg/dL   Creatinine, Ser 0.94 0.44 - 1.00 mg/dL   Calcium 9.6 8.9 - 10.3 mg/dL   Total Protein 7.2 6.5 - 8.1 g/dL   Albumin 4.1 3.5 - 5.0 g/dL   AST 19 15 - 41 U/L   ALT 16 14 - 54 U/L   Alkaline Phosphatase 59 38 - 126 U/L   Total Bilirubin 0.4 0.3 - 1.2 mg/dL   GFR calc non Af Amer >60 >60 mL/min   GFR calc Af Amer >60 >60 mL/min   Anion gap 11 5 - 15  CBC  Result Value Ref Range   WBC 16.4 (H) 3.6 - 11.0 K/uL   RBC 4.13 3.80 - 5.20 MIL/uL   Hemoglobin 13.2 12.0 - 16.0 g/dL   HCT 39.0 35.0 - 47.0 %   MCV 94.3 80.0 - 100.0 fL   MCH 32.0 26.0 - 34.0 pg   MCHC 34.0 32.0 - 36.0 g/dL   RDW 12.8 11.5 - 14.5 %   Platelets 381 150 - 440 K/uL  Troponin I  Result Value Ref Range   Troponin I <0.03 <0.03 ng/mL  Lactic acid, plasma  Result Value Ref Range   Lactic Acid, Venous 1.0 0.5 - 1.9 mmol/L   Ct  Angio Chest/abd/pel For Dissection W And/or Wo Contrast  Result Date: 06/13/2017 CLINICAL DATA:  Mid abdominal pain accompanied by nausea and vomiting. Evaluate for aortic dissection as well as mesenteric ischemia. EXAM: CT ANGIOGRAPHY CHEST, ABDOMEN AND PELVIS TECHNIQUE: Multidetector CT imaging through the chest, abdomen and pelvis was performed using the standard protocol during bolus administration of intravenous contrast. Multiplanar reconstructed images and MIPs were obtained and reviewed to evaluate the vascular anatomy. CONTRAST:  162mL ISOVUE-370 IOPAMIDOL (ISOVUE-370) INJECTION 76% COMPARISON:  None. FINDINGS: CTA CHEST FINDINGS Cardiovascular: Satisfactory opacification of the thoracic aorta and pulmonary arterial system. There are no filling defects in the central, lobar, segmental or subsegmental pulmonary artery branches to suggest acute pulmonary embolism. Great vessels are normal in course and caliber. Normal heart size. No significant pericardial fluid/thickening. No aortic aneurysm or dissection. Mediastinum/Nodes: No discrete thyroid nodules. Unremarkable esophagus. No pathologically enlarged axillary, mediastinal or hilar lymph nodes. Lungs/Pleura: Biapical pleuroparenchymal scarring. Minimal noncalcified pleural plaque along the  periphery of the right hemithorax. No pneumothorax. No pleural effusion. Small focus of airspace opacity in the medial right middle lobe abutting the right heart border compatible with atelectasis or pneumonia. Atelectasis noted at each lung base as well. 4 mm left lower lobe pulmonary nodule without calcification, series 8/106. Musculoskeletal: No aggressive appearing focal osseous lesions. Bilateral subpectoral saline breast implants without rupture. Review of the MIP images confirms the above findings. CTA ABDOMEN AND PELVIS FINDINGS VASCULAR Aorta: Normal caliber aorta without aneurysm, dissection, vasculitis or significant stenosis. Celiac: Patent without evidence  of aneurysm, dissection, vasculitis or significant stenosis. SMA: Patent without evidence of aneurysm, dissection, vasculitis or significant stenosis. Renals: Single renal artery to the right and dual arteries to the left. No significant stenosis, dissection nor evidence of fibromuscular dysplasia. No aneurysm noted. IMA: Patent Inflow: Patent without evidence of aneurysm, dissection, vasculitis or significant stenosis. Veins: Patent splenic and portal veins without thrombosis. No occlusive thrombus within the IVC, common iliac, external and internal iliac veins nor common femoral veins. Review of the MIP images confirms the above findings. NON-VASCULAR Hepatobiliary: No focal liver abnormality is seen. No gallstones, gallbladder wall thickening, or biliary dilatation. Pancreas: Unremarkable. No pancreatic ductal dilatation or surrounding inflammatory changes. Spleen: Normal in size without focal abnormality. Adrenals/Urinary Tract: Normal bilateral adrenal glands. Symmetric enhancement of both kidneys with probable tiny too small to characterize cyst in the interpolar right kidney measuring 5 mm. No obstructive uropathy. The urinary bladder is physiologically distended distended without focal mural thickening or calculus. Stomach/Bowel: No pneumatosis, bowel obstruction, mass nor inflammation. Scattered gas and stool containing large bowel without significant stool retention. Mild fluid-filled distention of small bowel loops deep within the pelvis may reflect a small bowel enteritis. No evidence of acute appendicitis. Lymphatic: No adenopathy. Reproductive: Status post hysterectomy. No adnexal masses. Other: No free air nor free fluid. Musculoskeletal: Degenerative disc disease L5-S1. No acute nor aggressive osseous abnormality. Review of the MIP images confirms the above findings. IMPRESSION: Chest CT: 1. 4 mm left lower lobe pulmonary nodule. No follow-up needed if patient is low-risk. Non-contrast chest CT can  be considered in 12 months if patient is high-risk. This recommendation follows the consensus statement: Guidelines for Management of Incidental Pulmonary Nodules Detected on CT Images: From the Fleischner Society 2017; Radiology 2017; 284:228-243. 2. No aortic aneurysm or dissection. 3. No acute pulmonary embolus. 4. Right middle lobe airspace opacity abutting the right heart border likely representing atelectasis and/or minimal pneumonia. No effusion or pneumothorax. CT abdomen and pelvis: 1. No evidence of intestinal ischemia. 2. Mild fluid-filled distention of small bowel loops deep within the pelvis may reflect small bowel enteritis. No mechanical bowel obstruction. 3. Tiny too small to characterize 5 mm hypodensity in the interpolar right kidney consistent statistically with a cyst. 4. Degenerative disc disease L5-S1. Electronically Signed   By: Ashley Royalty M.D.   On: 06/13/2017 23:48

## 2017-06-14 NOTE — Transfer of Care (Signed)
Immediate Anesthesia Transfer of Care Note  Patient: Kathryn Pugh  Procedure(s) Performed: APPENDECTOMY LAPAROSCOPIC (N/A Abdomen)  Patient Location: PACU  Anesthesia Type:General  Level of Consciousness: awake and responds to stimulation  Airway & Oxygen Therapy: Patient Spontanous Breathing and Patient connected to face mask oxygen  Post-op Assessment: Report given to RN and Post -op Vital signs reviewed and stable  Post vital signs: Reviewed and stable  Last Vitals:  Vitals Value Taken Time  BP 102/70 06/14/2017  5:33 PM  Temp    Pulse 84 06/14/2017  5:33 PM  Resp 17 06/14/2017  5:33 PM  SpO2 100 % 06/14/2017  5:33 PM  Vitals shown include unvalidated device data.  Last Pain:  Vitals:   06/14/17 1435  TempSrc: Oral  PainSc: 3          Complications: No apparent anesthesia complications

## 2017-06-14 NOTE — Anesthesia Preprocedure Evaluation (Signed)
Anesthesia Evaluation  Patient identified by MRN, date of birth, ID band Patient awake    Reviewed: Allergy & Precautions, NPO status , Patient's Chart, lab work & pertinent test results  Airway Mallampati: II  TM Distance: >3 FB     Dental  (+) Teeth Intact   Pulmonary neg pulmonary ROS,    Pulmonary exam normal        Cardiovascular negative cardio ROS Normal cardiovascular exam     Neuro/Psych negative neurological ROS  negative psych ROS   GI/Hepatic Neg liver ROS,   Endo/Other  negative endocrine ROS  Renal/GU negative Renal ROS  negative genitourinary   Musculoskeletal negative musculoskeletal ROS (+)   Abdominal Normal abdominal exam  (+)   Peds negative pediatric ROS (+)  Hematology negative hematology ROS (+)   Anesthesia Other Findings   Reproductive/Obstetrics                             Anesthesia Physical Anesthesia Plan  ASA: I and emergent  Anesthesia Plan: General   Post-op Pain Management:    Induction: Intravenous, Cricoid pressure planned and Rapid sequence  PONV Risk Score and Plan:   Airway Management Planned: Oral ETT  Additional Equipment:   Intra-op Plan:   Post-operative Plan: Extubation in OR  Informed Consent: I have reviewed the patients History and Physical, chart, labs and discussed the procedure including the risks, benefits and alternatives for the proposed anesthesia with the patient or authorized representative who has indicated his/her understanding and acceptance.   Dental advisory given  Plan Discussed with: CRNA and Surgeon  Anesthesia Plan Comments:         Anesthesia Quick Evaluation

## 2017-06-14 NOTE — Anesthesia Procedure Notes (Signed)
Procedure Name: Intubation Performed by: Denajah Farias, CRNA Pre-anesthesia Checklist: Patient identified, Patient being monitored, Timeout performed, Emergency Drugs available and Suction available Patient Re-evaluated:Patient Re-evaluated prior to induction Oxygen Delivery Method: Circle system utilized Preoxygenation: Pre-oxygenation with 100% oxygen Induction Type: IV induction Ventilation: Mask ventilation without difficulty Laryngoscope Size: Mac and 3 Grade View: Grade II Tube type: Oral Tube size: 7.0 mm Number of attempts: 1 Airway Equipment and Method: Stylet Placement Confirmation: ETT inserted through vocal cords under direct vision,  positive ETCO2 and breath sounds checked- equal and bilateral Secured at: 22 cm Tube secured with: Tape Dental Injury: Teeth and Oropharynx as per pre-operative assessment        

## 2017-06-14 NOTE — ED Triage Notes (Signed)
Pt to ED via POV for lower abd pain. Pt seen in ED last night for same complaint. Pt states that pain is worse this morning. Pt c/o nausea but no vomiting at this time.

## 2017-06-14 NOTE — ED Provider Notes (Signed)
Pain improved.  Patient stable for discharge.   Darel Hong, MD 06/14/17 [phone]

## 2017-06-14 NOTE — H&P (Signed)
Kathryn Pugh is an 57 y.o. female.    Chief Complaint: Right lower quadrant abdominal pain  HPI: This a patient who is in the emergency room last night with the acute onset of periumbilical pain that was quite severe.  She had a work-up last night that did not show appendicitis.  She returned today with signs of right lower quadrant pain and tenderness suggestive of appendicitis and was confirmed on CT scan.  She vomited yesterday and is nauseated now but no longer vomiting.  She is never had an episode like this before.  She denies fevers or chills.  Her only surgeries were a C-section and a hysterectomy sparing her ovaries.  She had a colonoscopy 10 years ago but does have a family history of colon cancer in her mother. She takes Spironolactone for her skin.  She works as a "blocker" does not smoke but drinks wine regularly  History reviewed. No pertinent past medical history.  Past Surgical History:  Procedure Laterality Date  . ABDOMINAL HYSTERECTOMY    . CESAREAN SECTION      No family history on file. Social History:  reports that she has never smoked. She has never used smokeless tobacco. She reports that she drank alcohol. She reports that she has current or past drug history.  Allergies:  Allergies  Allergen Reactions  . Phenergan [Promethazine Hcl]      (Not in a hospital admission)   Review of Systems  Constitutional: Negative.   HENT: Negative.   Eyes: Negative.   Respiratory: Negative.   Cardiovascular: Negative.   Gastrointestinal: Positive for abdominal pain, nausea and vomiting. Negative for blood in stool, constipation, diarrhea, heartburn and melena.  Genitourinary: Negative.   Musculoskeletal: Negative.   Skin: Negative.   Neurological: Negative.   Endo/Heme/Allergies: Negative.   Psychiatric/Behavioral: Negative.      Physical Exam:  BP (!) 92/58   Pulse 92   Temp 98.3 F (36.8 C) (Oral)   Resp 14   Ht 5' 4"  (1.626 m)   Wt 110 lb (49.9  kg)   SpO2 94%   BMI 18.88 kg/m   Physical Exam  Constitutional: She appears well-developed and well-nourished.  Non-toxic appearance. She does not appear ill. No distress.  HENT:  Head: Normocephalic and atraumatic.  Eyes: Pupils are equal, round, and reactive to light. EOM are normal.  Cardiovascular: Normal rate, regular rhythm and normal heart sounds.  Pulmonary/Chest: Effort normal and breath sounds normal. No respiratory distress. She has no wheezes.  Abdominal: Soft. Normal appearance. She exhibits no ascites. There is tenderness in the right lower quadrant. There is rebound, guarding and tenderness at McBurney's point. There is no rigidity and no CVA tenderness. No hernia.  Tenderness maximal at McBurney's point with a positive Rovsing sign.  Skin: Skin is warm and dry. She is not diaphoretic.  Vitals reviewed.       Results for orders placed or performed during the hospital encounter of 06/14/17 (from the past 48 hour(s))  Lactic acid, plasma     Status: None   Collection Time: 06/14/17  7:48 AM  Result Value Ref Range   Lactic Acid, Venous 1.3 0.5 - 1.9 mmol/L    Comment: Performed at [facility], [address], [place], [place] [address]  Comprehensive metabolic panel     Status: Abnormal   Collection Time: 06/14/17  7:49 AM  Result Value Ref Range   Sodium 137 135 - 145 mmol/L   Potassium 4.2 3.5 - 5.1  mmol/L   Chloride 106 101 - 111 mmol/L   CO2 24 22 - 32 mmol/L   Glucose, Bld 123 (H) 65 - 99 mg/dL   BUN 16 6 - 20 mg/dL   Creatinine, Ser 0.97 0.44 - 1.00 mg/dL   Calcium 9.3 8.9 - 10.3 mg/dL   Total Protein 6.6 6.5 - 8.1 g/dL   Albumin 3.7 3.5 - 5.0 g/dL   AST 19 15 - 41 U/L   ALT 16 14 - 54 U/L   Alkaline Phosphatase 55 38 - 126 U/L   Total Bilirubin 0.9 0.3 - 1.2 mg/dL   GFR calc non Af Amer >60 >60 mL/min   GFR calc Af Amer >60 >60 mL/min    Comment: (NOTE) The eGFR has been calculated using the CKD EPI equation. This calculation has not  been validated in all clinical situations. eGFR's persistently <60 mL/min signify possible Chronic Kidney Disease.    Anion gap 7 5 - 15    Comment: Performed at [facility], [address] [place], [place] [address]  CBC with Differential     Status: Abnormal   Collection Time: 06/14/17  7:49 AM  Result Value Ref Range   WBC 23.4 (H) 3.6 - 11.0 K/uL   RBC 3.94 3.80 - 5.20 MIL/uL   Hemoglobin 12.4 12.0 - 16.0 g/dL   HCT 37.4 35.0 - 47.0 %   MCV 94.9 80.0 - 100.0 fL   MCH 31.5 26.0 - 34.0 pg   MCHC 33.2 32.0 - 36.0 g/dL   RDW 12.9 11.5 - 14.5 %   Platelets 350 150 - 440 K/uL   Neutrophils Relative % 85 %   Neutro Abs 19.8 (H) 1.4 - 6.5 K/uL   Lymphocytes Relative 10 %   Lymphs Abs 2.3 1.0 - 3.6 K/uL   Monocytes Relative 5 %   Monocytes Absolute 1.2 (H) 0.2 - 0.9 K/uL   Eosinophils Relative 0 %   Eosinophils Absolute 0.0 0 - 0.7 K/uL   Basophils Relative 0 %   Basophils Absolute 0.0 0 - 0.1 K/uL    Comment: Performed at [facility], [address] [place], [place] [address]  Ethanol     Status: None   Collection Time: 06/14/17  7:49 AM  Result Value Ref Range   Alcohol, Ethyl (B) <10 <10 mg/dL    Comment:        LOWEST DETECTABLE LIMIT FOR SERUM ALCOHOL IS 10 mg/dL FOR MEDICAL PURPOSES ONLY Performed at [facility], [address] [place], [place] [address]   Lipase, blood     Status: None   Collection Time: 06/14/17  7:49 AM  Result Value Ref Range   Lipase 21 11 - 51 U/L    Comment: Performed at [facility], [address] [place], [place] [address]  Troponin I     Status: None   Collection Time: 06/14/17  7:49 AM  Result Value Ref Range   Troponin I <0.03 <0.03 ng/mL    Comment: Performed at [facility], [place], [place] [address]   Ct Abdomen Pelvis W Contrast  Result Date: 06/14/2017 CLINICAL DATA:  Persistent acute abdominal pain, elevated white count EXAM: CT ABDOMEN AND PELVIS WITH  CONTRAST TECHNIQUE: Multidetector CT imaging of the abdomen and pelvis was performed using the standard protocol following bolus administration of intravenous contrast. CONTRAST:  22m ISOVUE-370 IOPAMIDOL (ISOVUE-370) INJECTION 76% COMPARISON:  06/13/2017 FINDINGS: Lower chest: Bandlike scar versus atelectasis in the right  middle lobe medially along the right cardiac border. Left lung base clear. Normal heart size. Breast implants noted. Small hiatal hernia present. No pericardial pleural effusion. Hepatobiliary: No focal hepatic abnormality. Minimal central biliary prominence. Vicarious contrast excretion in the gallbladder. Gallbladder is nondistended. Common bile duct nondilated. Pancreas: Unremarkable. No pancreatic ductal dilatation or surrounding inflammatory changes. Spleen: Normal in size without focal abnormality. Adrenals/Urinary Tract: Adrenal glands are unremarkable. Kidneys are normal, without renal calculi, focal lesion, or hydronephrosis. Bladder is unremarkable. Stomach/Bowel: In the right hemipelvis, there is a fluid distended blind ended loop of bowel measuring 12 mm in diameter with mucosal enhancement tracking back to the dilated posterior aspect of the cecum in the midline. Appearance is suspicious for acute appendicitis. No associated bowel obstruction, significant dilatation, ileus, or free air. Small amount of new abdominopelvic ascites diffusely. No signs pneumatosis or bowel ischemia. Vascular/Lymphatic: Mesenteric and renal vasculature appear patent. Aortoiliac vasculature also patent. No veno-occlusive process. No adenopathy. Reproductive: Remote hysterectomy.  No adnexal mass. Other: No inguinal or abdominal wall hernia. Musculoskeletal: No acute or significant osseous findings. Degenerative changes at L5-S1 with disc space narrowing. IMPRESSION: Findings highly suspicious for acute nonruptured appendicitis with new developing abdominopelvic free fluid/ascites. These results were called  by telephone at the time of interpretation on 06/14/2017 at 11:57 am to Dr. Charlotte Crumb , who verbally acknowledged these results. Electronically Signed   By: Jerilynn Mages.  Shick M.D.   On: 06/14/2017 11:58   Ct Angio Chest/abd/pel For Dissection W And/or Wo Contrast  Result Date: 06/13/2017 CLINICAL DATA:  Mid abdominal pain accompanied by nausea and vomiting. Evaluate for aortic dissection as well as mesenteric ischemia. EXAM: CT ANGIOGRAPHY CHEST, ABDOMEN AND PELVIS TECHNIQUE: Multidetector CT imaging through the chest, abdomen and pelvis was performed using the standard protocol during bolus administration of intravenous contrast. Multiplanar reconstructed images and MIPs were obtained and reviewed to evaluate the vascular anatomy. CONTRAST:  113m ISOVUE-370 IOPAMIDOL (ISOVUE-370) INJECTION 76% COMPARISON:  None. FINDINGS: CTA CHEST FINDINGS Cardiovascular: Satisfactory opacification of the thoracic aorta and pulmonary arterial system. There are no filling defects in the central, lobar, segmental or subsegmental pulmonary artery branches to suggest acute pulmonary embolism. Great vessels are normal in course and caliber. Normal heart size. No significant pericardial fluid/thickening. No aortic aneurysm or dissection. Mediastinum/Nodes: No discrete thyroid nodules. Unremarkable esophagus. No pathologically enlarged axillary, mediastinal or hilar lymph nodes. Lungs/Pleura: Biapical pleuroparenchymal scarring. Minimal noncalcified pleural plaque along the periphery of the right hemithorax. No pneumothorax. No pleural effusion. Small focus of airspace opacity in the medial right middle lobe abutting the right heart border compatible with atelectasis or pneumonia. Atelectasis noted at each lung base as well. 4 mm left lower lobe pulmonary nodule without calcification, series 8/106. Musculoskeletal: No aggressive appearing focal osseous lesions. Bilateral subpectoral saline breast implants without rupture. Review of the MIP  images confirms the above findings. CTA ABDOMEN AND PELVIS FINDINGS VASCULAR Aorta: Normal caliber aorta without aneurysm, dissection, vasculitis or significant stenosis. Celiac: Patent without evidence of aneurysm, dissection, vasculitis or significant stenosis. SMA: Patent without evidence of aneurysm, dissection, vasculitis or significant stenosis. Renals: Single renal artery to the right and dual arteries to the left. No significant stenosis, dissection nor evidence of fibromuscular dysplasia. No aneurysm noted. IMA: Patent Inflow: Patent without evidence of aneurysm, dissection, vasculitis or significant stenosis. Veins: Patent splenic and portal veins without thrombosis. No occlusive thrombus within the IVC, common iliac, external and internal iliac veins nor common femoral veins. Review of the MIP images  confirms the above findings. NON-VASCULAR Hepatobiliary: No focal liver abnormality is seen. No gallstones, gallbladder wall thickening, or biliary dilatation. Pancreas: Unremarkable. No pancreatic ductal dilatation or surrounding inflammatory changes. Spleen: Normal in size without focal abnormality. Adrenals/Urinary Tract: Normal bilateral adrenal glands. Symmetric enhancement of both kidneys with probable tiny too small to characterize cyst in the interpolar right kidney measuring 5 mm. No obstructive uropathy. The urinary bladder is physiologically distended distended without focal mural thickening or calculus. Stomach/Bowel: No pneumatosis, bowel obstruction, mass nor inflammation. Scattered gas and stool containing large bowel without significant stool retention. Mild fluid-filled distention of small bowel loops deep within the pelvis may reflect a small bowel enteritis. No evidence of acute appendicitis. Lymphatic: No adenopathy. Reproductive: Status post hysterectomy. No adnexal masses. Other: No free air nor free fluid. Musculoskeletal: Degenerative disc disease L5-S1. No acute nor aggressive osseous  abnormality. Review of the MIP images confirms the above findings. IMPRESSION: Chest CT: 1. 4 mm left lower lobe pulmonary nodule. No follow-up needed if patient is low-risk. Non-contrast chest CT can be considered in 12 months if patient is high-risk. This recommendation follows the consensus statement: Guidelines for Management of Incidental Pulmonary Nodules Detected on CT Images: From the Fleischner Society 2017; Radiology 2017; 284:228-243. 2. No aortic aneurysm or dissection. 3. No acute pulmonary embolus. 4. Right middle lobe airspace opacity abutting the right heart border likely representing atelectasis and/or minimal pneumonia. No effusion or pneumothorax. CT abdomen and pelvis: 1. No evidence of intestinal ischemia. 2. Mild fluid-filled distention of small bowel loops deep within the pelvis may reflect small bowel enteritis. No mechanical bowel obstruction. 3. Tiny too small to characterize 5 mm hypodensity in the interpolar right kidney consistent statistically with a cyst. 4. Degenerative disc disease L5-S1. Electronically Signed   By: Ashley Royalty M.D.   On: 06/13/2017 23:48     Assessment/Plan  Elevated white blood cell count and CT scan is personally reviewed.  I spoke to Dr. Burlene Arnt about the patient's care as well as with nursing.  This a patient with less than 24 hours of abdominal pain starting last night at 7 PM.  She is had nausea and a single emesis yesterday with no diarrhea.  Her work-up is suggested appendicitis and this is congruent with her right lower quadrant pain and tenderness.  She has an elevated white blood cell count.  I recommended laparoscopic appendectomy.  I discussed with she and her husband the rationale for offering surgery the options of observation and the risk of bleeding infection conversion to an open procedure bowel injury and adhesions any which could require further surgery etc. she understood and agreed to proceed antibiotics have been given I will start IV  fluids and schedule for this afternoon once OR time becomes available.  Florene [place], MD, FACS

## 2017-06-14 NOTE — ED Notes (Signed)
Patient presents to the ED with severe lower abdominal pain that became worse around 7am.  Patient states pain is all across her abdomen, below her umbilicus.  Patient is moaning and crying in triage, curled in fetal position.  Patient had CT performed last night.

## 2017-06-14 NOTE — ED Notes (Signed)
Pt resting in bed with eye closed, rise and fall of chest noted.  Husband at bedside, cardiac monitor intact.

## 2017-06-14 NOTE — ED Provider Notes (Addendum)
[facility] Emergency Department Provider Note  ____________________________________________   I have reviewed the triage vital signs and the nursing notes. Where available I have reviewed prior notes and, if possible and indicated, outside hospital notes.    HISTORY  Chief Complaint Abdominal Pain    HPI Kathryn Pugh is a 57 y.o. female history of cosmetic surgery, hysterectomy in the past for "a polyp or something", no history of endometriosis or chronic recurrent abdominal pain presents today bouncing back to the emergency room with abdominal pain.  The pain started yesterday, she was seen in the emergency room for the same.  Patient had a CT chest abdomen pelvis which I reviewed.  There is a questionable enteritis but no other significant findings especially in the abdomen where her pain is.  She denies any vaginal discharge or dysuria.  Patient had reassuring work-up including lactic acid and was discharged with pain control.  However, the pain has gradually come back and now she is in intense pain.  Patient is in the fetal position, she declines to give much of a history at this time because she needs pain medication first.  She states is a cramping lower abdominal discomfort which is severe, never completely went away, and it gradually came back.  She denies any vomiting or diarrhea or fever.  No vaginal discharge no other complaints except for "it hurts".  Limited history given patient reluctance to answer questions without antecedent administration of pain medications.    History reviewed. No pertinent past medical history.  There are no active problems to display for this patient.   Past Surgical History:  Procedure Laterality Date  . ABDOMINAL HYSTERECTOMY    . CESAREAN SECTION      Prior to Admission medications   Medication Sig Start Date End Date Taking? Authorizing Provider  metoCLOPramide (REGLAN) 10 MG tablet Take 1 tablet (10 mg total) by  mouth every 6 (six) hours as needed for nausea. 06/14/17 06/14/18  Merlyn Lot, MD    Allergies Phenergan [promethazine hcl]  No family history on file.  Social History Social History   Tobacco Use  . Smoking status: Never Smoker  . Smokeless tobacco: Never Used  Substance Use Topics  . Alcohol use: Not Currently  . Drug use: Not Currently    Review of Systems Constitutional: No fever/chills Eyes: No visual changes. ENT: No sore throat. No stiff neck no neck pain Cardiovascular: Denies chest pain. Respiratory: Denies shortness of breath. Gastrointestinal:   no vomiting.  No diarrhea.  No constipation. Genitourinary: Negative for dysuria. Musculoskeletal: Negative lower extremity swelling Skin: Negative for rash. Neurological: Negative for severe headaches, focal weakness or numbness.   ____________________________________________   PHYSICAL EXAM:  VITAL SIGNS: ED Triage Vitals [06/14/17 0734]  Enc Vitals Group     BP (!) 105/44     Pulse Rate 65     Resp 16     Temp 98.3 F (36.8 C)     Temp Source Oral     SpO2 100 %     Weight 110 lb (49.9 kg)     Height 5\' 4"  (1.626 m)     Head Circumference      Peak Flow      Pain Score 10     Pain Loc      Pain Edu?      Excl. in [place]?     Constitutional: Alert and oriented.  Medically speaking, patient is nontoxic however she is lying in the  fetal position on the bed holding her stomach Eyes: Conjunctivae are normal Head: Atraumatic HEENT: No congestion/rhinnorhea. Mucous membranes are moist.  Oropharynx non-erythematous Neck:   Nontender with no meningismus, no masses, no stridor Cardiovascular: Normal rate, regular rhythm. Grossly normal heart sounds.  Good peripheral circulation. Respiratory: Normal respiratory effort.  No retractions. Lungs CTAB. Abdominal: Soft and positive lower abdominal discomfort, soft abdomen voluntary guarding but no rigidity no surgical signs no peritoneal signs.  Nondistended  positive bowel sounds Back:  There is no focal tenderness or step off.  there is no midline tenderness there are no lesions noted. there is no CVA tenderness Musculoskeletal: No lower extremity tenderness, no upper extremity tenderness. No joint effusions, no DVT signs strong distal pulses no edema Neurologic:  Normal speech and language. No gross focal neurologic deficits are appreciated.  Skin:  Skin is warm, dry and intact. No rash noted. Psychiatric: Mood and affect are normal. Speech and behavior are normal.  ____________________________________________   LABS (all labs ordered are listed, but only abnormal results are displayed)  Labs Reviewed  LACTIC ACID, PLASMA  LACTIC ACID, PLASMA  COMPREHENSIVE METABOLIC PANEL  CBC WITH DIFFERENTIAL/PLATELET  URINALYSIS, COMPLETE (UACMP) WITH MICROSCOPIC  URINE DRUG SCREEN, QUALITATIVE ([facility] ONLY)  ETHANOL  LIPASE, BLOOD  TROPONIN I    Pertinent labs  results that were available during my care of the patient were reviewed by me and considered in my medical decision making (see chart for details). ____________________________________________  EKG  I personally interpreted any EKGs ordered by me or triage  ____________________________________________  RADIOLOGY  Pertinent labs & imaging results that were available during my care of the patient were reviewed by me and considered in my medical decision making (see chart for details). If possible, patient and/or family made aware of any abnormal findings.  Ct Angio Chest/abd/pel For Dissection W And/or Wo Contrast  Result Date: 06/13/2017 CLINICAL DATA:  Mid abdominal pain accompanied by nausea and vomiting. Evaluate for aortic dissection as well as mesenteric ischemia. EXAM: CT ANGIOGRAPHY CHEST, ABDOMEN AND PELVIS TECHNIQUE: Multidetector CT imaging through the chest, abdomen and pelvis was performed using the standard protocol during bolus administration of intravenous contrast.  Multiplanar reconstructed images and MIPs were obtained and reviewed to evaluate the vascular anatomy. CONTRAST:  11mL ISOVUE-370 IOPAMIDOL (ISOVUE-370) INJECTION 76% COMPARISON:  None. FINDINGS: CTA CHEST FINDINGS Cardiovascular: Satisfactory opacification of the thoracic aorta and pulmonary arterial system. There are no filling defects in the central, lobar, segmental or subsegmental pulmonary artery branches to suggest acute pulmonary embolism. Great vessels are normal in course and caliber. Normal heart size. No significant pericardial fluid/thickening. No aortic aneurysm or dissection. Mediastinum/Nodes: No discrete thyroid nodules. Unremarkable esophagus. No pathologically enlarged axillary, mediastinal or hilar lymph nodes. Lungs/Pleura: Biapical pleuroparenchymal scarring. Minimal noncalcified pleural plaque along the periphery of the right hemithorax. No pneumothorax. No pleural effusion. Small focus of airspace opacity in the medial right middle lobe abutting the right heart border compatible with atelectasis or pneumonia. Atelectasis noted at each lung base as well. 4 mm left lower lobe pulmonary nodule without calcification, series 8/106. Musculoskeletal: No aggressive appearing focal osseous lesions. Bilateral subpectoral saline breast implants without rupture. Review of the MIP images confirms the above findings. CTA ABDOMEN AND PELVIS FINDINGS VASCULAR Aorta: Normal caliber aorta without aneurysm, dissection, vasculitis or significant stenosis. Celiac: Patent without evidence of aneurysm, dissection, vasculitis or significant stenosis. SMA: Patent without evidence of aneurysm, dissection, vasculitis or significant stenosis. Renals: Single renal artery to the right  and dual arteries to the left. No significant stenosis, dissection nor evidence of fibromuscular dysplasia. No aneurysm noted. IMA: Patent Inflow: Patent without evidence of aneurysm, dissection, vasculitis or significant stenosis. Veins:  Patent splenic and portal veins without thrombosis. No occlusive thrombus within the IVC, common iliac, external and internal iliac veins nor common femoral veins. Review of the MIP images confirms the above findings. NON-VASCULAR Hepatobiliary: No focal liver abnormality is seen. No gallstones, gallbladder wall thickening, or biliary dilatation. Pancreas: Unremarkable. No pancreatic ductal dilatation or surrounding inflammatory changes. Spleen: Normal in size without focal abnormality. Adrenals/Urinary Tract: Normal bilateral adrenal glands. Symmetric enhancement of both kidneys with probable tiny too small to characterize cyst in the interpolar right kidney measuring 5 mm. No obstructive uropathy. The urinary bladder is physiologically distended distended without focal mural thickening or calculus. Stomach/Bowel: No pneumatosis, bowel obstruction, mass nor inflammation. Scattered gas and stool containing large bowel without significant stool retention. Mild fluid-filled distention of small bowel loops deep within the pelvis may reflect a small bowel enteritis. No evidence of acute appendicitis. Lymphatic: No adenopathy. Reproductive: Status post hysterectomy. No adnexal masses. Other: No free air nor free fluid. Musculoskeletal: Degenerative disc disease L5-S1. No acute nor aggressive osseous abnormality. Review of the MIP images confirms the above findings. IMPRESSION: Chest CT: 1. 4 mm left lower lobe pulmonary nodule. No follow-up needed if patient is low-risk. Non-contrast chest CT can be considered in 12 months if patient is high-risk. This recommendation follows the consensus statement: Guidelines for Management of Incidental Pulmonary Nodules Detected on CT Images: From the Fleischner Society 2017; Radiology 2017; 284:228-243. 2. No aortic aneurysm or dissection. 3. No acute pulmonary embolus. 4. Right middle lobe airspace opacity abutting the right heart border likely representing atelectasis and/or  minimal pneumonia. No effusion or pneumothorax. CT abdomen and pelvis: 1. No evidence of intestinal ischemia. 2. Mild fluid-filled distention of small bowel loops deep within the pelvis may reflect small bowel enteritis. No mechanical bowel obstruction. 3. Tiny too small to characterize 5 mm hypodensity in the interpolar right kidney consistent statistically with a cyst. 4. Degenerative disc disease L5-S1. Electronically Signed   By: Ashley Royalty M.D.   On: 06/13/2017 23:48   ____________________________________________    PROCEDURES  Procedure(s) performed: None  Procedures  Critical Care performed: None  ____________________________________________   INITIAL IMPRESSION / ASSESSMENT AND PLAN / ED COURSE  Pertinent labs & imaging results that were available during my care of the patient were reviewed by me and considered in my medical decision making (see chart for details).  Patient here with recurrent significant abdominal pain since yesterday.  She had a comprehensive and thorough work-up yesterday which was quite unrevealing.  CT scan which I personally reviewed showed no evidence of ischemia, mild fluid-filled distention of small bowel loops deep within the pelvis may reflect bowel enteritis with no mechanical bowel obstruction at the time, patient likely is working on a viral illness based on this finding.  However, it is difficult to fully evaluate her given the fact that she has a very significant pain response at this time.  We will give her pain medication to allow [place] to better evaluate.  I am reluctant to repeat the CT scan after less than 8 hours, we will repeat the lactic blood work and we will give her fluids pain medications antiemetics and we will reassess  ----------------------------------------- 9:01 AM on 06/14/2017 -----------------------------------------  More comfortable after Dilaudid but still with severe abdominal pain, she is holding  her lower abdomen.  She does  not have upper abdomen pain, she has guarding however and her abdomen on the lower abdomen feels it is not rigid certainly more contracted in the upper abdomen, consistent with either guarding which I cannot get her to stop doing or early surgical pathology.  I did call radiology and we went through the radiologist's CT scan from yesterday and they see [place] no evidence of pathology.  They do not see any evidence of ovarian pathology there either.  Patient has had a partial hysterectomy.  It is possible I suppose that this could be a ovarian issue however, her white count is is significantly elevated at this time and her pain is worse and it is a diffuse nonfocal discomfort.  I will unfortunately have to repeat the CT scan I think given patient's presentation to rule out other entities that is negative then we will do ovarian ultrasounds although again ovaries were not even visualized yesterday on CT it making acute ovarian pathology less likely.    ____________________________________________   FINAL CLINICAL IMPRESSION(S) / ED DIAGNOSES  Final diagnoses:  None      This chart was dictated using voice recognition software.  Despite best efforts to proofread,  errors can occur which can change meaning.      Schuyler Amor, MD 06/14/17 0814    Schuyler Amor, MD 06/14/17 4818    Schuyler Amor, MD 06/14/17 [phone]

## 2017-06-14 NOTE — ED Notes (Signed)
Surgeon in room to assess patient.  Will continue to monitor.

## 2017-06-14 NOTE — Anesthesia Postprocedure Evaluation (Signed)
Anesthesia Post Note  Patient: Kathryn Pugh  Procedure(s) Performed: APPENDECTOMY LAPAROSCOPIC (N/A Abdomen)  Patient location during evaluation: PACU Anesthesia Type: General Level of consciousness: awake and alert and oriented Pain management: pain level controlled Vital Signs Assessment: post-procedure vital signs reviewed and stable Respiratory status: spontaneous breathing Cardiovascular status: blood pressure returned to baseline Anesthetic complications: no     Last Vitals:  Vitals:   06/14/17 1817 06/14/17 1832  BP: 97/62 (!) 96/59  Pulse: 85 82  Resp: 19 15  Temp:  37.4 C  SpO2: 96% 95%    Last Pain:  Vitals:   06/14/17 1832  TempSrc:   PainSc: 2                  [name]

## 2017-06-15 ENCOUNTER — Encounter: Payer: Self-pay | Admitting: Surgery

## 2017-06-15 LAB — URINALYSIS, COMPLETE (UACMP) WITH MICROSCOPIC
BILIRUBIN URINE: NEGATIVE
Bacteria, UA: NONE SEEN
GLUCOSE, UA: NEGATIVE mg/dL
HGB URINE DIPSTICK: NEGATIVE
KETONES UR: NEGATIVE mg/dL
LEUKOCYTES UA: NEGATIVE
Nitrite: NEGATIVE
PH: 6 (ref 5.0–8.0)
Protein, ur: NEGATIVE mg/dL
Specific Gravity, Urine: 1.017 (ref 1.005–1.030)

## 2017-06-15 LAB — URINE DRUG SCREEN, QUALITATIVE (ARMC ONLY)
Amphetamines, Ur Screen: NOT DETECTED
BARBITURATES, UR SCREEN: NOT DETECTED
BENZODIAZEPINE, UR SCRN: POSITIVE — AB
CANNABINOID 50 NG, UR ~~LOC~~: NOT DETECTED
Cocaine Metabolite,Ur ~~LOC~~: NOT DETECTED
MDMA (Ecstasy)Ur Screen: NOT DETECTED
METHADONE SCREEN, URINE: NOT DETECTED
Opiate, Ur Screen: POSITIVE — AB
PHENCYCLIDINE (PCP) UR S: NOT DETECTED
Tricyclic, Ur Screen: NOT DETECTED

## 2017-06-15 MED ORDER — OXYCODONE-ACETAMINOPHEN 5-325 MG PO TABS
1.0000 | ORAL_TABLET | Freq: Four times a day (QID) | ORAL | Status: DC | PRN
  Administered 2017-06-15 – 2017-06-16 (×4): 1 via ORAL
  Filled 2017-06-15 (×4): qty 1

## 2017-06-15 MED ORDER — KETOROLAC TROMETHAMINE 30 MG/ML IJ SOLN
30.0000 mg | Freq: Four times a day (QID) | INTRAMUSCULAR | Status: DC
  Administered 2017-06-15 – 2017-06-16 (×3): 30 mg via INTRAVENOUS
  Filled 2017-06-15 (×3): qty 1

## 2017-06-15 NOTE — Progress Notes (Signed)
06/15/2017  Subjective: Patient is 1 Day Post-Op s/p lap appy.  No acute events but patient reports some tightness and headache this morning.  Her left lateral dressing change changed as it had been saturated with serosanguinous fluid.  Vital signs: Temp:  [98.5 F (36.9 C)-99.6 F (37.6 C)] 98.5 F (36.9 C) (05/09 0522) Pulse Rate:  [82-92] 82 (05/09 0522) Resp:  [14-20] 18 (05/09 0522) BP: (81-112)/(58-73) 94/68 (05/09 0522) SpO2:  [93 %-100 %] 99 % (05/09 0522)   Intake/Output: 05/08 0701 - 05/09 0700 In: 2667 [I.V.:1518; IV Piggyback:1149] Out: 1055 [Urine:1050; Blood:5] Last BM Date: 06/13/17(per pt)  Physical Exam: Constitutional: No acute distress Abdomen:  Soft, nondistended, appropriately tender to palpation.  Incisions clean, intact with steri strips in place.  New dressing applied to left lateral wound given drainage.  Labs:  Recent Labs    06/13/17 2049 06/14/17 0749  WBC 16.4* 23.4*  HGB 13.2 12.4  HCT 39.0 37.4  PLT 381 350   Recent Labs    06/13/17 2049 06/14/17 0749  NA 137 137  K 4.2 4.2  CL 104 106  CO2 22 24  GLUCOSE 107* 123*  BUN 18 16  CREATININE 0.94 0.97  CALCIUM 9.6 9.3   No results for input(s): LABPROT, INR in the last 72 hours.  Imaging: Ct Abdomen Pelvis W Contrast  Result Date: 06/14/2017 CLINICAL DATA:  Persistent acute abdominal pain, elevated white count EXAM: CT ABDOMEN AND PELVIS WITH CONTRAST TECHNIQUE: Multidetector CT imaging of the abdomen and pelvis was performed using the standard protocol following bolus administration of intravenous contrast. CONTRAST:  25mL ISOVUE-370 IOPAMIDOL (ISOVUE-370) INJECTION 76% COMPARISON:  06/13/2017 FINDINGS: Lower chest: Bandlike scar versus atelectasis in the right middle lobe medially along the right cardiac border. Left lung base clear. Normal heart size. Breast implants noted. Small hiatal hernia present. No pericardial pleural effusion. Hepatobiliary: No focal hepatic abnormality. Minimal  central biliary prominence. Vicarious contrast excretion in the gallbladder. Gallbladder is nondistended. Common bile duct nondilated. Pancreas: Unremarkable. No pancreatic ductal dilatation or surrounding inflammatory changes. Spleen: Normal in size without focal abnormality. Adrenals/Urinary Tract: Adrenal glands are unremarkable. Kidneys are normal, without renal calculi, focal lesion, or hydronephrosis. Bladder is unremarkable. Stomach/Bowel: In the right hemipelvis, there is a fluid distended blind ended loop of bowel measuring 12 mm in diameter with mucosal enhancement tracking back to the dilated posterior aspect of the cecum in the midline. Appearance is suspicious for acute appendicitis. No associated bowel obstruction, significant dilatation, ileus, or free air. Small amount of new abdominopelvic ascites diffusely. No signs pneumatosis or bowel ischemia. Vascular/Lymphatic: Mesenteric and renal vasculature appear patent. Aortoiliac vasculature also patent. No veno-occlusive process. No adenopathy. Reproductive: Remote hysterectomy.  No adnexal mass. Other: No inguinal or abdominal wall hernia. Musculoskeletal: No acute or significant osseous findings. Degenerative changes at L5-S1 with disc space narrowing. IMPRESSION: Findings highly suspicious for acute nonruptured appendicitis with new developing abdominopelvic free fluid/ascites. These results were called by telephone at the time of interpretation on 06/14/2017 at 11:57 am to Dr. Charlotte Crumb , who verbally acknowledged these results. Electronically Signed   By: Jerilynn Mages.  Shick M.D.   On: 06/14/2017 11:58    Assessment/Plan: 57 yo female s/p lap appy  --advance diet as tolerated today --possibly discharge to home today, depending on how she progresses.   Melvyn Neth, [place]

## 2017-06-15 NOTE — Progress Notes (Signed)
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 Patient experienced some discomfort from surgery, but is eager to be discharged. Patient did not express spiritual concerns.

## 2017-06-16 LAB — HIV ANTIBODY (ROUTINE TESTING W REFLEX): HIV Screen 4th Generation wRfx: NONREACTIVE

## 2017-06-16 LAB — SURGICAL PATHOLOGY

## 2017-06-16 MED ORDER — IBUPROFEN 600 MG PO TABS: 600.0000 mg | ORAL_TABLET | Freq: Three times a day (TID) | ORAL | 0 refills | Status: DC | PRN

## 2017-06-16 MED ORDER — GUAIFENESIN-DM 100-10 MG/5ML PO SYRP
5.0000 mL | ORAL_SOLUTION | ORAL | Status: DC | PRN
  Administered 2017-06-16: 5 mL via ORAL
  Filled 2017-06-16: qty 5

## 2017-06-16 MED ORDER — AMOXICILLIN-POT CLAVULANATE 875-125 MG PO TABS: 1.0000 | ORAL_TABLET | Freq: Two times a day (BID) | ORAL | 0 refills | Status: DC

## 2017-06-16 MED ORDER — OXYCODONE HCL 5 MG PO TABS: 5.0000 mg | ORAL_TABLET | Freq: Four times a day (QID) | ORAL | 0 refills | Status: DC | PRN

## 2017-06-16 NOTE — Discharge Instructions (Signed)
Laparoscopic Appendectomy, Adult, Care After °These instructions give you information about caring for yourself after your procedure. Your doctor may also give you more specific instructions. Call your doctor if you have any problems or questions after your procedure. °Follow these instructions at home: °Medicines °· Take over-the-counter and prescription medicines only as told by your doctor. °· Do not drive for 24 hours if you received a sedative. °· Do not drive or use heavy machinery while taking prescription pain medicine. °· If you were prescribed an antibiotic medicine, take it as told by your doctor. Do not stop taking it even if you start to feel better. °Activity °· Do not lift anything that is heavier than 10 pounds (4.5 kg) for 3 weeks or as told by your doctor. °· Do not play contact sports for 3 weeks or as told by your doctor. °· Slowly return to your normal activities. °Bathing °· Keep your cuts from surgery (incisions) clean and dry. °? Gently wash the cuts with soap and water. °? Rinse the cuts with water until the soap is gone. °? Pat the cuts dry with a clean towel. Do not rub the cuts. °· You may take showers after 48 hours. °· Do not take baths, swim, or use a hot tub for 2 weeks or as told by your doctor. °Cut Care °· Follow instructions from your doctor about how to take care of your cuts. Make sure you: °? Wash your hands with soap and water before you change your bandage (dressing). If you do not have soap and water, use hand sanitizer. °? Change your bandage as told by your doctor. °? Leave stitches (sutures), skin glue, or skin tape (adhesive) strips in place. They may need to stay in place for 2 weeks or longer. If tape strips get loose and curl up, you may trim the loose edges. Do not remove tape strips completely unless your doctor says it is okay. °· Check your cuts every day for signs of infection. Check for: °? More redness, swelling, or pain. °? More fluid or  blood. °? Warmth. °? Pus or a bad smell. °Other Instructions °· If you were sent home with a drain, follow instructions from your doctor about how to use it and care for it. °· Take deep breaths. This helps to keep your lungs from getting swollen (inflamed). °· To help with constipation: °? Drink plenty of fluids. °? Eat plenty of fruits and vegetables. °· Keep all follow-up visits as told by your doctor. This is important. °Contact a doctor if: °· You have more redness, swelling, or pain around a cut from surgery. °· You have more fluid or blood coming from a cut. °· Your cut feels warm to the touch. °· You have pus or a bad smell coming from a cut or a bandage. °· The edges of a cut break open after the stitches have been taken out. °· You have pain in your shoulders that gets worse. °· You feel dizzy or you pass out (faint). °· You have shortness of breath. °· You keep feeling sick to your stomach (nauseous). °· You keep throwing up (vomiting). °· You get diarrhea or you cannot control your poop. °· You lose your appetite. °· You have swelling or pain in your legs. °Get help right away if: °· You have a fever. °· You get a rash. °· You have trouble breathing. °· You have sharp pains in your chest. °This information is not intended to replace advice given   to you by your health care provider. Make sure you discuss any questions you have with your health care provider. °Document Released: 11/20/2008 Document Revised: 07/02/2015 Document Reviewed: 07/14/2014 °Elsevier Interactive Patient Education © 2018 [employer] ° °

## 2017-06-16 NOTE — Progress Notes (Signed)
Discharge instructions reviewed with patient and husband.  Understanding was verbalized and all questions were answered.  IV removed without complication.  Patient discharged home via wheelchair in stable condition escorted by nursing staff.

## 2017-06-16 NOTE — Discharge Summary (Signed)
Patient ID: Kathryn Pugh MRN: [id number] DOB/AGE: 56-16-1962 56 y.o.  Admit date: 06/14/2017 Discharge date: 06/16/2017   Discharge Diagnoses:  Active Problems:   Acute appendicitis   Procedures:  Laparoscopic appendectomy  Hospital Course:  Patient was admitted on 5/8 with acute appendicitis and went to the OR on same day.  Post-op, patient had some nausea and pain issues which were controlled better changing some medications.  She was tolerating a diet, ambulating.  On exam, patient was in no acute distress with stable vital signs.  Her abdomen was soft, non-distended, appropriately tender.  Incisions clean, intact.  Mild serosanguinous drainage from left lateral incision.  Consults: None  Disposition: Discharge disposition: 01-Home or Self Care       Discharge Instructions    Call MD for:  difficulty breathing, headache or visual disturbances   Complete by:  As directed    Call MD for:  persistant nausea and vomiting   Complete by:  As directed    Call MD for:  redness, tenderness, or signs of infection (pain, swelling, redness, odor or green/yellow discharge around incision site)   Complete by:  As directed    Call MD for:  severe uncontrolled pain   Complete by:  As directed    Call MD for:  temperature >100.4   Complete by:  As directed    Change dressing (specify)   Complete by:  As directed    May apply dry gauze dressing to the left lateral incision as needed.   Diet - low sodium heart healthy   Complete by:  As directed    Discharge instructions   Complete by:  As directed    1.  Patient may shower, but do not scrub wound heavily and dab dry only. 2.  Do not submerge wounds in pool/tub 3.  Do not remove steri strips and allow them to fall off on their own.   Driving Restrictions   Complete by:  As directed    Do not drive while taking narcotics for pain control.   Increase activity slowly   Complete by:  As directed    Lifting restrictions   Complete by:   As directed    No heavy lifting or pushing of more than 10-15 lbs for 4 weeks.     Allergies as of 06/16/2017      Reactions   Phenergan [promethazine Hcl]       Medication List    TAKE these medications   amoxicillin-clavulanate 875-125 MG tablet Commonly known as:  AUGMENTIN Take 1 tablet by mouth 2 (two) times daily for 7 days.   HAIR/SKIN/NAILS 1250-7.5-7.5 MCG-MG-UNT Chew Generic drug:  Biotin w/ Vitamins C & E Chew by mouth as directed.   ibuprofen 600 MG tablet Commonly known as:  ADVIL,MOTRIN Take 1 tablet (600 mg total) by mouth every 8 (eight) hours as needed.   magnesium oxide 400 MG tablet Commonly known as:  MAG-OX Take 400 mg by mouth daily.   metoCLOPramide 10 MG tablet Commonly known as:  REGLAN Take 1 tablet (10 mg total) by mouth every 6 (six) hours as needed for nausea.   oxyCODONE 5 MG immediate release tablet Commonly known as:  Oxy IR/ROXICODONE Take 1 tablet (5 mg total) by mouth every 6 (six) hours as needed for severe pain.   spironolactone 100 MG tablet Commonly known as:  ALDACTONE Take 100 mg by mouth daily.            Discharge Care Instructions  (  From admission, onward)        Start     Ordered   06/16/17 0000  Change dressing (specify)    Comments:  May apply dry gauze dressing to the left lateral incision as needed.   06/16/17 0825     Follow-up Information    Florene Glen, MD Follow up in 10 day(s).   Specialty:  Surgery Why:  Follow up in 10-14 days. Contact information: [address] [place] [id number] [phone]

## 2017-06-19 ENCOUNTER — Encounter: Payer: Self-pay | Admitting: Emergency Medicine

## 2017-06-19 ENCOUNTER — Emergency Department: Payer: 59

## 2017-06-19 ENCOUNTER — Inpatient Hospital Stay
Admission: EM | Admit: 2017-06-19 | Discharge: 2017-06-22 | DRG: 862 | Disposition: A | Discharge status: Home / Self Care | Payer: 59 | Attending: Surgery | Admitting: Surgery

## 2017-06-19 ENCOUNTER — Other Ambulatory Visit: Payer: Self-pay

## 2017-06-19 DIAGNOSIS — T8143XA Infection following a procedure, organ and space surgical site, initial encounter: Secondary | ICD-10-CM | POA: Diagnosis not present

## 2017-06-19 DIAGNOSIS — Z9049 Acquired absence of other specified parts of digestive tract: Secondary | ICD-10-CM

## 2017-06-19 DIAGNOSIS — Z888 Allergy status to other drugs, medicaments and biological substances status: Secondary | ICD-10-CM

## 2017-06-19 DIAGNOSIS — K651 Peritoneal abscess: Secondary | ICD-10-CM | POA: Diagnosis present

## 2017-06-19 DIAGNOSIS — Z90710 Acquired absence of both cervix and uterus: Secondary | ICD-10-CM

## 2017-06-19 DIAGNOSIS — K567 Ileus, unspecified: Secondary | ICD-10-CM

## 2017-06-19 DIAGNOSIS — T8149XA Infection following a procedure, other surgical site, initial encounter: Secondary | ICD-10-CM | POA: Diagnosis present

## 2017-06-19 DIAGNOSIS — Y838 Other surgical procedures as the cause of abnormal reaction of the patient, or of later complication, without mention of misadventure at the time of the procedure: Secondary | ICD-10-CM | POA: Diagnosis present

## 2017-06-19 DIAGNOSIS — G8918 Other acute postprocedural pain: Secondary | ICD-10-CM

## 2017-06-19 LAB — COMPREHENSIVE METABOLIC PANEL
ALT: 22 U/L (ref 14–54)
AST: 23 U/L (ref 15–41)
Albumin: 2.7 g/dL — ABNORMAL LOW (ref 3.5–5.0)
Alkaline Phosphatase: 88 U/L (ref 38–126)
Anion gap: 9 (ref 5–15)
BUN: 11 mg/dL (ref 6–20)
CHLORIDE: 106 mmol/L (ref 101–111)
CO2: 22 mmol/L (ref 22–32)
CREATININE: 0.7 mg/dL (ref 0.44–1.00)
Calcium: 8.8 mg/dL — ABNORMAL LOW (ref 8.9–10.3)
GFR calc non Af Amer: >60 mL/min (ref >60)
Glucose, Bld: 108 mg/dL — ABNORMAL HIGH (ref 65–99)
Potassium: 3.9 mmol/L (ref 3.5–5.1)
Sodium: 137 mmol/L (ref 135–145)
Total Bilirubin: 0.6 mg/dL (ref 0.3–1.2)
Total Protein: 6.1 g/dL — ABNORMAL LOW (ref 6.5–8.1)

## 2017-06-19 LAB — CBC WITH DIFFERENTIAL/PLATELET
BASOS ABS: 0 10*3/uL (ref 0–0.1)
Basophils Relative: 0 %
EOS ABS: 0.2 10*3/uL (ref 0–0.7)
EOS PCT: 2 %
HCT: 36.6 % (ref 35.0–47.0)
Hemoglobin: 12.2 g/dL (ref 12.0–16.0)
Lymphocytes Relative: 11 %
Lymphs Abs: 1.4 10*3/uL (ref 1.0–3.6)
MCH: 31.3 pg (ref 26.0–34.0)
MCHC: 33.4 g/dL (ref 32.0–36.0)
MCV: 93.7 fL (ref 80.0–100.0)
Monocytes Absolute: 1.1 10*3/uL — ABNORMAL HIGH (ref 0.2–0.9)
Monocytes Relative: 8 %
Neutro Abs: 10.2 10*3/uL — ABNORMAL HIGH (ref 1.4–6.5)
Neutrophils Relative %: 79 %
PLATELETS: 422 10*3/uL (ref 150–440)
RBC: 3.91 MIL/uL (ref 3.80–5.20)
RDW: 13.1 % (ref 11.5–14.5)
WBC: 13 10*3/uL — AB (ref 3.6–11.0)

## 2017-06-19 LAB — LIPASE, BLOOD: LIPASE: 30 U/L (ref 11–51)

## 2017-06-19 LAB — LACTIC ACID, PLASMA: Lactic Acid, Venous: 0.9 mmol/L (ref 0.5–1.9)

## 2017-06-19 MED ORDER — SODIUM CHLORIDE 0.9 % IV BOLUS
1000.0000 mL | Freq: Once | INTRAVENOUS | Status: AC
  Administered 2017-06-19: 1000 mL via INTRAVENOUS

## 2017-06-19 MED ORDER — ONDANSETRON HCL 4 MG/2ML IJ SOLN
4.0000 mg | Freq: Once | INTRAMUSCULAR | Status: AC
  Administered 2017-06-19: 4 mg via INTRAVENOUS
  Filled 2017-06-19: qty 2

## 2017-06-19 MED ORDER — IOPAMIDOL (ISOVUE-370) INJECTION 76%
75.0000 mL | Freq: Once | INTRAVENOUS | Status: AC | PRN
  Administered 2017-06-19: 75 mL via INTRAVENOUS

## 2017-06-19 MED ORDER — MORPHINE SULFATE (PF) 4 MG/ML IV SOLN
4.0000 mg | Freq: Once | INTRAVENOUS | Status: AC
  Administered 2017-06-19: 4 mg via INTRAVENOUS
  Filled 2017-06-19: qty 1

## 2017-06-19 NOTE — ED Provider Notes (Signed)
[facility] Emergency Department Provider Note   ____________________________________________   First MD Initiated Contact with Patient 06/19/17 2312     (approximate)  I have reviewed the triage vital signs and the nursing notes.   HISTORY  Chief Complaint Post-op Problem    HPI Kathryn Pugh is a 57 y.o. female who comes into the hospital today with some abdominal pain.  The patient had an appendectomy approximately 5 days ago and she states that she is been in a lot of pain since she is been home.  She thinks she is taking too much pain medication for being so far out but she still having a lot of pain.  She states that she has had a bowel movement since surgery but did have to take some magnesium and only had a bowel movement yesterday.  The patient states that she cannot eat because she is nauseous.  She is had no vomiting and no fevers.  The patient has also been having some shooting pain in her lower abdomen.  Her pain is currently an 8 out of 10 in intensity.  She took her pain medicine at 6:00 but states that it did not help.  She did not contact the surgeon to talk about this pain.  The patient is here today for evaluation of her symptoms.   History reviewed. No pertinent past medical history.  Patient Active Problem List   Diagnosis Date Noted  . Acute appendicitis     Past Surgical History:  Procedure Laterality Date  . ABDOMINAL HYSTERECTOMY    . CESAREAN SECTION    . LAPAROSCOPIC APPENDECTOMY N/A 06/14/2017   Procedure: APPENDECTOMY LAPAROSCOPIC;  Surgeon: Florene Glen, MD;  Location: [facility] ORS;  Service: General;  Laterality: N/A;    Prior to Admission medications   Medication Sig Start Date End Date Taking? Authorizing Provider  amoxicillin-clavulanate (AUGMENTIN) 875-125 MG tablet Take 1 tablet by mouth 2 (two) times daily for 7 days. 06/16/17 06/23/17  Olean Ree, MD  Biotin w/ Vitamins C & E (HAIR/SKIN/NAILS) 1250-7.5-7.5  MCG-MG-UNT CHEW Chew by mouth as directed.    [provider]  ibuprofen (ADVIL,MOTRIN) 600 MG tablet Take 1 tablet (600 mg total) by mouth every 8 (eight) hours as needed. 06/16/17   Olean Ree, MD  magnesium oxide (MAG-OX) 400 MG tablet Take 400 mg by mouth daily.    [provider]  metoCLOPramide (REGLAN) 10 MG tablet Take 1 tablet (10 mg total) by mouth every 6 (six) hours as needed for nausea. Patient not taking: Reported on 06/14/2017 06/14/17 06/14/18  Merlyn Lot, MD  oxyCODONE (OXY IR/ROXICODONE) 5 MG immediate release tablet Take 1 tablet (5 mg total) by mouth every 6 (six) hours as needed for severe pain. 06/16/17   Olean Ree, MD  spironolactone (ALDACTONE) 100 MG tablet Take 100 mg by mouth daily. 05/15/17   [provider]    Allergies Phenergan [promethazine hcl]  No family history on file.  Social History Social History   Tobacco Use  . Smoking status: Never Smoker  . Smokeless tobacco: Never Used  Substance Use Topics  . Alcohol use: Not Currently  . Drug use: Not Currently    Review of Systems  Constitutional: No fever/chills Eyes: No visual changes. ENT: No sore throat. Cardiovascular: Denies chest pain. Respiratory: Denies shortness of breath. Gastrointestinal:  abdominal pain, nausea, no vomiting.  No diarrhea.  No constipation. Genitourinary: Negative for dysuria. Musculoskeletal: Negative for back pain. Skin: Negative for rash.  Neurological: Negative for headaches, focal weakness or numbness.   ____________________________________________   PHYSICAL EXAM:  VITAL SIGNS: ED Triage Vitals  Enc Vitals Group     BP 06/19/17 2002 134/73     Pulse Rate 06/19/17 2002 75     Resp 06/19/17 2002 20     Temp 06/19/17 2002 99.2 F (37.3 C)     Temp Source 06/19/17 2002 Oral     SpO2 06/19/17 2002 100 %     Weight 06/19/17 2002 110 lb (49.9 kg)     Height 06/19/17 2002 5\' 4"  (1.626 m)     Head Circumference --      Peak  Flow --      Pain Score 06/19/17 2012 10     Pain Loc --      Pain Edu? --      Excl. in [place]? --     Constitutional: Alert and oriented. Well appearing and in moderate distress. Eyes: Conjunctivae are normal. PERRL. EOMI. Head: Atraumatic. Nose: No congestion/rhinnorhea. Mouth/Throat: Mucous membranes are moist.  Oropharynx non-erythematous. Cardiovascular: Normal rate, regular rhythm. Grossly normal heart sounds.  Good peripheral circulation. Respiratory: Normal respiratory effort.  No retractions. Lungs CTAB. Gastrointestinal: Soft with some RLQ tenderness to palpation. No distention. Positive bowel sounds Musculoskeletal: No lower extremity tenderness nor edema.   Neurologic:  Normal speech and language.  Skin:  Skin is warm, dry and intact.  Psychiatric: Mood and affect are normal.   ____________________________________________   LABS (all labs ordered are listed, but only abnormal results are displayed)  Labs Reviewed  CBC WITH DIFFERENTIAL/PLATELET - Abnormal; Notable for the following components:      Result Value   WBC 13.0 (*)    Neutro Abs 10.2 (*)    Monocytes Absolute 1.1 (*)    All other components within normal limits  COMPREHENSIVE METABOLIC PANEL - Abnormal; Notable for the following components:   Glucose, Bld 108 (*)    Calcium 8.8 (*)    Total Protein 6.1 (*)    Albumin 2.7 (*)    All other components within normal limits  LACTIC ACID, PLASMA  LIPASE, BLOOD  LACTIC ACID, PLASMA  URINALYSIS, COMPLETE (UACMP) WITH MICROSCOPIC   ____________________________________________  EKG  none ____________________________________________  RADIOLOGY  ED MD interpretation:  CT abd and pelvis: Interval appendectomy, 9.1 x 6.5 cm mildly rim-enhancing fluid collection in the posterior pelvis, additional smaller mildly rim-enhancing fluid collection in the pelvis interspersed amongst small bowel, development of small bilateral pleural effusions with partial  consolidations in the bilateral lower lobes, small amount of free fluid in the upper abdomen thickened appearance of distal small bowel loops within the pelvis, possible mild wall thickening of the distal stomach question gastritis.  Official radiology report(s): Ct Abdomen Pelvis W Contrast  Addendum Date: 06/20/2017   ADDENDUM REPORT: 06/20/2017 00:42 ADDENDUM: Possible gallbladder wall thickening versus adjacent fluid. If gallbladder pathology is suspected, could correlate with right upper quadrant ultrasound Electronically Signed   By: Donavan Foil M.D.   On: 06/20/2017 00:42   Result Date: 06/20/2017 CLINICAL DATA:  Appendectomy on Wednesday with persistent lower abdominal pain EXAM: CT ABDOMEN AND PELVIS WITH CONTRAST TECHNIQUE: Multidetector CT imaging of the abdomen and pelvis was performed using the standard protocol following bolus administration of intravenous contrast. CONTRAST:  54mL ISOVUE-370 IOPAMIDOL (ISOVUE-370) INJECTION 76% COMPARISON:  06/14/2017 FINDINGS: Lower chest: Development of small bilateral pleural effusions some of which is sub pulmonic. Partial consolidations within the bilateral lower lobes, atelectasis versus  pneumonia. Atelectasis at the right middle lobe. Normal heart size. Partially visualized bilateral breast implants. Small amount of pericardial effusion at the cardiac base. Hepatobiliary: Prominence of the central intra hepatic ducts. No focal abnormality. No calcified gallstones. Possible gallbladder wall thickening or mild adjacent fluid. Pancreas: Unremarkable. No pancreatic ductal dilatation or surrounding inflammatory changes. Spleen: Normal in size without focal abnormality. Adrenals/Urinary Tract: Adrenal glands are unremarkable. Kidneys are normal, without renal calculi, focal lesion, or hydronephrosis. Bladder is unremarkable. Stomach/Bowel: Stomach is decompressed. Possible thickening of the gastric antrum. No dilated small bowel. Fluid-filled nonenlarged  small bowel. Interval appendectomy. Thickened loops of distal small bowel within the pelvis. Vascular/Lymphatic: Nonaneurysmal aorta.  No significant adenopathy Reproductive: Status post hysterectomy. No adnexal masses. Other: No free air. Small amount of free fluid in the upper abdomen. Mildly rim enhancing posterior pelvic fluid collection measuring 9.1 by 6.5 cm. Additional small mildly rim enhancing fluid collections within the pelvis interspersed amongst the small bowel. Musculoskeletal: Degenerative changes at L5-S1 IMPRESSION: 1. Interval appendectomy. 9.1 x 6.5 cm mildly rim enhancing fluid collection in the posterior pelvis, could reflect organizing postoperative fluid collection versus developing infected fluid collection. Additional smaller mildly rim enhancing fluid collections in the pelvis interspersed amongst small bowel. 2. Development of small bilateral pleural effusions. Partial consolidations in the bilateral lower lobes, atelectasis versus pneumonia 3. Small amount of free fluid in the upper abdomen. Thickened appearance of distal small bowel loops within the pelvis, likely reactive ileitis 4. Possible mild wall thickening of the distal stomach, question gastritis or peptic ulcer disease. Electronically Signed: By: Donavan Foil M.D. On: 06/20/2017 00:34    ____________________________________________   PROCEDURES  Procedure(s) performed: None  Procedures  Critical Care performed: No  ____________________________________________   INITIAL IMPRESSION / ASSESSMENT AND PLAN / ED COURSE  As part of my medical decision making, I reviewed the following data within the electronic MEDICAL RECORD NUMBER Notes from prior ED visits and [place] Controlled Substance Database   This is a 57 year old female who comes into the hospital today with some abdominal pain after having surgery 5 days ago.  My differential diagnosis includes intestinal perforation, abscess formation, postoperative  pain  The patient's white blood cell count is 13,000 but her previous white blood cell count was 24,000.  I will send the patient for CT scan for evaluation of postoperative complications of appendicitis.  I did contact Dr. Burt Knack and he agrees with that plan.  I will give the patient a dose of morphine and Zofran as well as a liter of normal saline.  She will be reassessed.  Clinical Course as of Jun 20 99  Tue Jun 20, 2017  0059 The patient CT scan shows a rim-enhancing lesion in her pelvis with a concern for an abscess.  I contacted Dr. Burt Knack who felt given the patient's appendix on presentation that she could have an abscess.  He recommends giving the patient some ciprofloxacin and Flagyl and he will admit her to his service.  The patient's pain is controlled at this time.  She has no complaints.   [AW]    Clinical Course User Index [AW] Loney Hering, MD     ____________________________________________   FINAL CLINICAL IMPRESSION(S) / ED DIAGNOSES  Final diagnoses:  Post-operative pain  Postoperative intra-abdominal abscess     ED Discharge Orders    None       Note:  This document was prepared using Dragon voice recognition software and may include unintentional dictation errors.  Loney Hering, MD 06/20/17 [phone]

## 2017-06-19 NOTE — ED Notes (Signed)
Pt to the er for lower abd pain, nausea, no vomiting, no fever,

## 2017-06-19 NOTE — ED Triage Notes (Addendum)
Appendectomy on Wed--Dr  Burt Knack; c/o persistent lower abd pain with no accomp symptoms; taking ibuprofen and oxycodone for pain without relief (last ds at 6pm); spoke with Dr Archie Balboa and orders obtained

## 2017-06-20 ENCOUNTER — Inpatient Hospital Stay: Payer: 59

## 2017-06-20 DIAGNOSIS — T8149XA Infection following a procedure, other surgical site, initial encounter: Secondary | ICD-10-CM | POA: Diagnosis present

## 2017-06-20 DIAGNOSIS — K567 Ileus, unspecified: Secondary | ICD-10-CM | POA: Diagnosis present

## 2017-06-20 DIAGNOSIS — T8143XA Infection following a procedure, organ and space surgical site, initial encounter: Secondary | ICD-10-CM | POA: Diagnosis present

## 2017-06-20 DIAGNOSIS — Z9049 Acquired absence of other specified parts of digestive tract: Secondary | ICD-10-CM | POA: Diagnosis not present

## 2017-06-20 DIAGNOSIS — Z90710 Acquired absence of both cervix and uterus: Secondary | ICD-10-CM | POA: Diagnosis not present

## 2017-06-20 DIAGNOSIS — K651 Peritoneal abscess: Secondary | ICD-10-CM | POA: Diagnosis present

## 2017-06-20 DIAGNOSIS — Z888 Allergy status to other drugs, medicaments and biological substances status: Secondary | ICD-10-CM | POA: Diagnosis not present

## 2017-06-20 DIAGNOSIS — Y838 Other surgical procedures as the cause of abnormal reaction of the patient, or of later complication, without mention of misadventure at the time of the procedure: Secondary | ICD-10-CM | POA: Diagnosis present

## 2017-06-20 HISTORY — DX: Infection following a procedure, other surgical site, initial encounter: T81.49XA

## 2017-06-20 LAB — URINALYSIS, COMPLETE (UACMP) WITH MICROSCOPIC
Bacteria, UA: NONE SEEN
Bilirubin Urine: NEGATIVE
GLUCOSE, UA: NEGATIVE mg/dL
Hgb urine dipstick: NEGATIVE
KETONES UR: 20 mg/dL — AB
LEUKOCYTES UA: NEGATIVE
Nitrite: NEGATIVE
PH: 6 (ref 5.0–8.0)
PROTEIN: NEGATIVE mg/dL
Specific Gravity, Urine: 1.012 (ref 1.005–1.030)

## 2017-06-20 LAB — CREATININE, SERUM
CREATININE: 0.67 mg/dL (ref 0.44–1.00)
GFR calc Af Amer: >60 mL/min (ref >60)

## 2017-06-20 LAB — CBC
HEMATOCRIT: 31 % — AB (ref 35.0–47.0)
Hemoglobin: 10.5 g/dL — ABNORMAL LOW (ref 12.0–16.0)
MCH: 31.7 pg (ref 26.0–34.0)
MCHC: 33.7 g/dL (ref 32.0–36.0)
MCV: 93.9 fL (ref 80.0–100.0)
Platelets: 340 10*3/uL (ref 150–440)
RBC: 3.3 MIL/uL — AB (ref 3.80–5.20)
RDW: 12.6 % (ref 11.5–14.5)
WBC: 12.3 10*3/uL — ABNORMAL HIGH (ref 3.6–11.0)

## 2017-06-20 LAB — LACTIC ACID, PLASMA: LACTIC ACID, VENOUS: 0.6 mmol/L (ref 0.5–1.9)

## 2017-06-20 MED ORDER — HEPARIN SODIUM (PORCINE) 5000 UNIT/ML IJ SOLN
5000.0000 [IU] | Freq: Three times a day (TID) | INTRAMUSCULAR | Status: DC
  Administered 2017-06-20 – 2017-06-22 (×5): 5000 [IU] via SUBCUTANEOUS
  Filled 2017-06-20 (×5): qty 1

## 2017-06-20 MED ORDER — CIPROFLOXACIN IN D5W 400 MG/200ML IV SOLN
400.0000 mg | Freq: Once | INTRAVENOUS | Status: AC
  Administered 2017-06-20: 400 mg via INTRAVENOUS
  Filled 2017-06-20: qty 200

## 2017-06-20 MED ORDER — KETOROLAC TROMETHAMINE 30 MG/ML IJ SOLN
30.0000 mg | Freq: Four times a day (QID) | INTRAMUSCULAR | Status: DC
  Administered 2017-06-20 – 2017-06-22 (×8): 30 mg via INTRAVENOUS
  Filled 2017-06-20 (×8): qty 1

## 2017-06-20 MED ORDER — MIDAZOLAM HCL 5 MG/5ML IJ SOLN
INTRAMUSCULAR | Status: AC
  Filled 2017-06-20: qty 5

## 2017-06-20 MED ORDER — SODIUM CHLORIDE 0.9% FLUSH
5.0000 mL | Freq: Three times a day (TID) | INTRAVENOUS | Status: DC
  Administered 2017-06-20 – 2017-06-22 (×7): 5 mL

## 2017-06-20 MED ORDER — METRONIDAZOLE IN NACL 5-0.79 MG/ML-% IV SOLN
500.0000 mg | Freq: Three times a day (TID) | INTRAVENOUS | Status: DC
  Administered 2017-06-20 – 2017-06-22 (×8): 500 mg via INTRAVENOUS
  Filled 2017-06-20 (×10): qty 100

## 2017-06-20 MED ORDER — ONDANSETRON HCL 4 MG/2ML IJ SOLN
4.0000 mg | Freq: Four times a day (QID) | INTRAMUSCULAR | Status: DC | PRN
  Administered 2017-06-20 – 2017-06-21 (×3): 4 mg via INTRAVENOUS
  Filled 2017-06-20 (×5): qty 2

## 2017-06-20 MED ORDER — GUAIFENESIN-DM 100-10 MG/5ML PO SYRP
5.0000 mL | ORAL_SOLUTION | ORAL | Status: DC | PRN
  Administered 2017-06-20 – 2017-06-21 (×3): 5 mL via ORAL
  Filled 2017-06-20 (×3): qty 5

## 2017-06-20 MED ORDER — ONDANSETRON HCL 4 MG PO TABS: 4.0000 mg | ORAL_TABLET | Freq: Four times a day (QID) | ORAL | Status: DC | PRN

## 2017-06-20 MED ORDER — MIDAZOLAM HCL 2 MG/2ML IJ SOLN
INTRAMUSCULAR | Status: AC | PRN
  Administered 2017-06-20 (×3): 1 mg via INTRAVENOUS

## 2017-06-20 MED ORDER — OXYCODONE HCL 5 MG PO TABS
5.0000 mg | ORAL_TABLET | Freq: Four times a day (QID) | ORAL | Status: DC | PRN
  Administered 2017-06-20 (×2): 5 mg via ORAL
  Filled 2017-06-20 (×3): qty 1

## 2017-06-20 MED ORDER — FENTANYL CITRATE (PF) 100 MCG/2ML IJ SOLN
INTRAMUSCULAR | Status: AC
  Filled 2017-06-20: qty 4

## 2017-06-20 MED ORDER — METRONIDAZOLE IN NACL 5-0.79 MG/ML-% IV SOLN
500.0000 mg | Freq: Once | INTRAVENOUS | Status: AC
  Administered 2017-06-20: 500 mg via INTRAVENOUS
  Filled 2017-06-20: qty 100

## 2017-06-20 MED ORDER — LACTATED RINGERS IV SOLN
INTRAVENOUS | Status: DC
  Administered 2017-06-20 – 2017-06-22 (×6): via INTRAVENOUS

## 2017-06-20 MED ORDER — CIPROFLOXACIN IN D5W 400 MG/200ML IV SOLN
400.0000 mg | Freq: Two times a day (BID) | INTRAVENOUS | Status: DC
  Administered 2017-06-20 – 2017-06-22 (×5): 400 mg via INTRAVENOUS
  Filled 2017-06-20 (×7): qty 200

## 2017-06-20 MED ORDER — FENTANYL CITRATE (PF) 100 MCG/2ML IJ SOLN
INTRAMUSCULAR | Status: AC | PRN
  Administered 2017-06-20 (×2): 50 ug via INTRAVENOUS

## 2017-06-20 MED ORDER — HEPARIN SODIUM (PORCINE) 5000 UNIT/ML IJ SOLN
5000.0000 [IU] | Freq: Three times a day (TID) | INTRAMUSCULAR | Status: DC
  Administered 2017-06-20: 5000 [IU] via SUBCUTANEOUS
  Filled 2017-06-20: qty 1

## 2017-06-20 MED ORDER — HYDROMORPHONE HCL 1 MG/ML IJ SOLN
0.5000 mg | INTRAMUSCULAR | Status: DC | PRN
  Administered 2017-06-20 – 2017-06-21 (×5): 0.5 mg via INTRAVENOUS
  Filled 2017-06-20 (×2): qty 0.5
  Filled 2017-06-20: qty 1
  Filled 2017-06-20 (×2): qty 0.5

## 2017-06-20 NOTE — Progress Notes (Addendum)
Initial Nutrition Assessment  DOCUMENTATION CODES:   Not applicable  INTERVENTION:   Pt at high refeeding risk; recommend monitor K, Mg, and P when oral intake improves.   RD will order snacks when diet advanced    Recommend MVI daily   NUTRITION DIAGNOSIS:   Inadequate oral intake related to acute illness as evidenced by per patient/family report.  GOAL:   Patient will meet greater than or equal to 90% of their needs  MONITOR:   PO intake, Supplement acceptance, Labs, Diet advancement, Weight trends, I & O's  REASON FOR ASSESSMENT:   Other (Comment)(low BMI)    ASSESSMENT:   57 y/o female with pelvic abscess following laparoscopic appendectomy 5/8  Met with pt in room today. Pt very tearful and in a lot of pain at time of RD visit. Pt reports decreased appetite and oral intake since discharge last week. Pt reports "I haven't eaten hardly anything for the past week". Pt on clear liquid diet today; reports she is too nauseas to eat. Pt declines supplements as she feels they are unhealthy. Pt takes [place] protein powder and a Goop vitamin for hair and nails at home. Pt is willing to take a MVI here and would like to have snacks when diet advanced. Per chart, pt with no recent wt history; pt is unsure if she has lost any weight. Pt declines exam today. Pt to have CT guided drain placement today. RD will order snacks when diet advanced.   Medications reviewed and include: fentanyl, heparin, ciprofloxacin, LRS @125ml /hr, metronidazole, hydromorphone, zofran, oxycodone   Labs reviewed: wbc- 12.3(H), Hgb 10.5(L), Hct 31.0(L)  Unable to complete Nutrition-Focused physical exam at this time.   Diet Order:   Diet Order           Diet clear liquid Room service appropriate? Yes; Fluid consistency: Thin  Diet effective now         EDUCATION NEEDS:   Education needs have been addressed  Skin:  Skin Assessment: Reviewed RN Assessment  Last BM:  pta  Height:   Ht Readings  from Last 1 Encounters:  06/19/17 5' 4"  (1.626 m)    Weight:   Wt Readings from Last 1 Encounters:  06/20/17 130 lb 4.7 oz (59.1 kg)    Ideal Body Weight:  54.5 kg  BMI:  Body mass index is 22.36 kg/m.  Estimated Nutritional Needs:   Kcal:  1400-1600kcal/day   Protein:  55-65g/day   Fluid:  >1.4L/day   Koleen Distance MS, RD, LDN Pager #- [phone] Office#- [phone] After Hours Pager: [phone]

## 2017-06-20 NOTE — Progress Notes (Signed)
Pharmacy consulted for anticoagulation continuation following IR procedure; Standard risk  Patient was on heparin 5000 units subq q8h prior to procedure.  Plan: Resume heparin 5000 units subq q8h 5/14@2200  per protocol  UFH prophylaxis:   Day 0  (at least 6 hours or at next standard dose interval)       Thomasenia Sales, PharmD, MBA, BCGP Clinical Pharmacist

## 2017-06-20 NOTE — Progress Notes (Signed)
Seen and examined pelvic abscess after appy Plan for IR drainage No emergent surgical intervention

## 2017-06-20 NOTE — H&P (Signed)
Kathryn Pugh is an 57 y.o. female.    Chief Complaint: Abdominal pain  HPI: This a patient with abdominal pain following a laparoscopic appendectomy for severe appendicitis.  At the time she had a considerable amount of cloudy fluid which was removed by aspiration and irrigation.  She was doing fairly well but has experienced worsening pain with increased bloating but no nausea or vomiting no fevers or chills and she took a laxative for bowel movements a couple of days ago.  Work-up in the emergency room suggested pelvic abscess.  History reviewed. No pertinent past medical history.  Past Surgical History:  Procedure Laterality Date  . ABDOMINAL HYSTERECTOMY    . CESAREAN SECTION    . LAPAROSCOPIC APPENDECTOMY N/A 06/14/2017   Procedure: APPENDECTOMY LAPAROSCOPIC;  Surgeon: Florene Glen, MD;  Location: [facility] ORS;  Service: General;  Laterality: N/A;    No family history on file. Social History:  reports that she has never smoked. She has never used smokeless tobacco. She reports that she drank alcohol. She reports that she has current or past drug history.  Allergies:  Allergies  Allergen Reactions  . Phenergan [Promethazine Hcl]      (Not in a hospital admission)   Review of Systems  Constitutional: Negative.   HENT: Negative.   Eyes: Negative.   Respiratory: Negative.   Cardiovascular: Negative.   Gastrointestinal: Positive for abdominal pain and constipation. Negative for diarrhea, nausea and vomiting.  Genitourinary: Negative.   Musculoskeletal: Negative.   Skin: Negative.   Neurological: Negative.   Endo/Heme/Allergies: Negative.   Psychiatric/Behavioral: Negative.      Physical Exam:  BP 117/83   Pulse 72   Temp 99.2 F (37.3 C) (Oral)   Resp 20   Ht 5' 4"  (1.626 m)   Wt 110 lb (49.9 kg)   SpO2 96%   BMI 18.88 kg/m   Physical Exam  Constitutional: She is oriented to person, place, and time. She appears well-developed and well-nourished.  HENT:   Head: Normocephalic and atraumatic.  Eyes: Pupils are equal, round, and reactive to light. Right eye exhibits no discharge. Left eye exhibits no discharge.  Pulmonary/Chest: Effort normal. No respiratory distress.  Abdominal: She exhibits distension. She exhibits no mass. There is tenderness. There is no guarding.  Distended and tympanitic wounds are clean with no erythema Steri-Strips in place no guarding or rebound but diffuse tenderness.  Musculoskeletal: Normal range of motion. She exhibits no edema.  Neurological: She is alert and oriented to person, place, and time.  Skin: Skin is warm and dry.  Vitals reviewed.       Results for orders placed or performed during the hospital encounter of 06/19/17 (from the past 48 hour(s))  CBC with Differential     Status: Abnormal   Collection Time: 06/19/17  8:22 PM  Result Value Ref Range   WBC 13.0 (H) 3.6 - 11.0 K/uL   RBC 3.91 3.80 - 5.20 MIL/uL   Hemoglobin 12.2 12.0 - 16.0 g/dL   HCT 36.6 35.0 - 47.0 %   MCV 93.7 80.0 - 100.0 fL   MCH 31.3 26.0 - 34.0 pg   MCHC 33.4 32.0 - 36.0 g/dL   RDW 13.1 11.5 - 14.5 %   Platelets 422 150 - 440 K/uL   Neutrophils Relative % 79 %   Neutro Abs 10.2 (H) 1.4 - 6.5 K/uL   Lymphocytes Relative 11 %   Lymphs Abs 1.4 1.0 - 3.6 K/uL   Monocytes Relative 8 %  Monocytes Absolute 1.1 (H) 0.2 - 0.9 K/uL   Eosinophils Relative 2 %   Eosinophils Absolute 0.2 0 - 0.7 K/uL   Basophils Relative 0 %   Basophils Absolute 0.0 0 - 0.1 K/uL    Comment: Performed at [facility], [address] [place], [place] [address]  Comprehensive metabolic panel     Status: Abnormal   Collection Time: 06/19/17  8:22 PM  Result Value Ref Range   Sodium 137 135 - 145 mmol/L   Potassium 3.9 3.5 - 5.1 mmol/L   Chloride 106 101 - 111 mmol/L   CO2 22 22 - 32 mmol/L   Glucose, Bld 108 (H) 65 - 99 mg/dL   BUN 11 6 - 20 mg/dL   Creatinine, Ser 0.70 0.44 - 1.00 mg/dL   Calcium 8.8 (L) 8.9 - 10.3 mg/dL   Total  Protein 6.1 (L) 6.5 - 8.1 g/dL   Albumin 2.7 (L) 3.5 - 5.0 g/dL   AST 23 15 - 41 U/L   ALT 22 14 - 54 U/L   Alkaline Phosphatase 88 38 - 126 U/L   Total Bilirubin 0.6 0.3 - 1.2 mg/dL   GFR calc non Af Amer >60 >60 mL/min   GFR calc Af Amer >60 >60 mL/min    Comment: (NOTE) The eGFR has been calculated using the CKD EPI equation. This calculation has not been validated in all clinical situations. eGFR's persistently <60 mL/min signify possible Chronic Kidney Disease.    Anion gap 9 5 - 15    Comment: Performed at [facility], [place], [place] [address]  Lactic acid, plasma     Status: None   Collection Time: 06/19/17  8:22 PM  Result Value Ref Range   Lactic Acid, Venous 0.9 0.5 - 1.9 mmol/L    Comment: Performed at [place] [facility], [address] [place], [place] [address]  Lipase, blood     Status: None   Collection Time: 06/19/17  8:22 PM  Result Value Ref Range   Lipase 30 11 - 51 U/L    Comment: Performed at [facility], [address] [place], [place] [address]   Ct Abdomen Pelvis W Contrast  Addendum Date: 06/20/2017   ADDENDUM REPORT: 06/20/2017 00:42 ADDENDUM: Possible gallbladder wall thickening versus adjacent fluid. If gallbladder pathology is suspected, could correlate with right upper quadrant ultrasound Electronically Signed   By: Donavan Foil M.D.   On: 06/20/2017 00:42   Result Date: 06/20/2017 CLINICAL DATA:  Appendectomy on Wednesday with persistent lower abdominal pain EXAM: CT ABDOMEN AND PELVIS WITH CONTRAST TECHNIQUE: Multidetector CT imaging of the abdomen and pelvis was performed using the standard protocol following bolus administration of intravenous contrast. CONTRAST:  18m ISOVUE-370 IOPAMIDOL (ISOVUE-370) INJECTION 76% COMPARISON:  06/14/2017 FINDINGS: Lower chest: Development of small bilateral pleural effusions some of which is sub pulmonic. Partial consolidations within the bilateral lower lobes,  atelectasis versus pneumonia. Atelectasis at the right middle lobe. Normal heart size. Partially visualized bilateral breast implants. Small amount of pericardial effusion at the cardiac base. Hepatobiliary: Prominence of the central intra hepatic ducts. No focal abnormality. No calcified gallstones. Possible gallbladder wall thickening or mild adjacent fluid. Pancreas: Unremarkable. No pancreatic ductal dilatation or surrounding inflammatory changes. Spleen: Normal in size without focal abnormality. Adrenals/Urinary Tract: Adrenal glands are unremarkable. Kidneys are normal, without renal calculi, focal lesion, or hydronephrosis. Bladder is unremarkable. Stomach/Bowel: Stomach is decompressed. Possible thickening of the gastric antrum. No dilated small bowel. Fluid-filled  nonenlarged small bowel. Interval appendectomy. Thickened loops of distal small bowel within the pelvis. Vascular/Lymphatic: Nonaneurysmal aorta.  No significant adenopathy Reproductive: Status post hysterectomy. No adnexal masses. Other: No free air. Small amount of free fluid in the upper abdomen. Mildly rim enhancing posterior pelvic fluid collection measuring 9.1 by 6.5 cm. Additional small mildly rim enhancing fluid collections within the pelvis interspersed amongst the small bowel. Musculoskeletal: Degenerative changes at L5-S1 IMPRESSION: 1. Interval appendectomy. 9.1 x 6.5 cm mildly rim enhancing fluid collection in the posterior pelvis, could reflect organizing postoperative fluid collection versus developing infected fluid collection. Additional smaller mildly rim enhancing fluid collections in the pelvis interspersed amongst small bowel. 2. Development of small bilateral pleural effusions. Partial consolidations in the bilateral lower lobes, atelectasis versus pneumonia 3. Small amount of free fluid in the upper abdomen. Thickened appearance of distal small bowel loops within the pelvis, likely reactive ileitis 4. Possible mild wall  thickening of the distal stomach, question gastritis or peptic ulcer disease. Electronically Signed: By: Donavan Foil M.D. On: 06/20/2017 00:34     Assessment/Plan  White blood cell count is reviewed and 13.  CT scans personally reviewed showing a large pelvic abscess.  This a patient with pelvic abscess following laparoscopic appendectomy where considerable fluid was aspirated and irrigated at the time of surgery.  This is amenable to drainage if IR agrees.  Will place orders for IR drainage and for IV antibiotics.  The rationale for this is discussed the potential for further surgery were reviewed she and her husband understood and agreed with this plan  Florene Glen, MD, FACS

## 2017-06-20 NOTE — Progress Notes (Signed)
Patient post abscess drain placement per Dr Vernard Gambles, tolerated procedure well. Vitals remained stable throughout procedure.

## 2017-06-20 NOTE — Procedures (Signed)
  Procedure: CT abscess drain  67f R transgluteal EBL:   minimal Complications:  none immediate  See full dictation in [employer].  Dillard Cannon MD Main # [phone] Pager  [phone]

## 2017-06-21 ENCOUNTER — Inpatient Hospital Stay: Payer: 59

## 2017-06-21 DIAGNOSIS — K567 Ileus, unspecified: Secondary | ICD-10-CM

## 2017-06-21 MED ORDER — ONDANSETRON HCL 4 MG/2ML IJ SOLN
4.0000 mg | INTRAMUSCULAR | Status: DC | PRN
  Administered 2017-06-21 – 2017-06-22 (×3): 4 mg via INTRAVENOUS
  Filled 2017-06-21 (×3): qty 2

## 2017-06-21 MED ORDER — ONDANSETRON HCL 4 MG PO TABS: 4.0000 mg | ORAL_TABLET | ORAL | Status: DC | PRN

## 2017-06-21 MED ORDER — PROCHLORPERAZINE EDISYLATE 10 MG/2ML IJ SOLN
5.0000 mg | INTRAMUSCULAR | Status: DC | PRN
  Administered 2017-06-21 (×2): 5 mg via INTRAVENOUS
  Filled 2017-06-21 (×2): qty 1

## 2017-06-21 MED ORDER — ACETAMINOPHEN 325 MG PO TABS: 650.0000 mg | ORAL_TABLET | ORAL | Status: DC | PRN

## 2017-06-21 NOTE — Progress Notes (Signed)
CC: Pelvic abscess Subjective: Feeling nauseated. KUB personally reviewed shows ileus, no free air   Objective: Vital signs in last 24 hours: Temp:  [98.1 F (36.7 C)-99 F (37.2 C)] 98.2 F (36.8 C) (05/15 0744) Pulse Rate:  [65-73] 65 (05/15 0744) Resp:  [9-18] 18 (05/15 0324) BP: (104-118)/(63-77) 108/65 (05/15 0744) SpO2:  [93 %-100 %] 99 % (05/15 0744) Weight:  [59.1 kg (130 lb 4.7 oz)] 59.1 kg (130 lb 4.7 oz) (05/14 1443)    Intake/Output from previous day: 05/14 0701 - 05/15 0700 In: 3626.2 [P.O.:120; I.V.:2756.2; IV Piggyback:740] Out: 2645 [Urine:2550; Drains:95] Intake/Output this shift: Total I/O In: 120 [P.O.:120] Out: -   Physical exam: NAD, alert Abd: soft, mildly distended incisions c/d/i, drain in place. No peritonitis Ext: no edema and well perfused  Lab Results: CBC  Recent Labs    06/19/17 2022 06/20/17 0522  WBC 13.0* 12.3*  HGB 12.2 10.5*  HCT 36.6 31.0*  PLT 422 340   BMET Recent Labs    06/19/17 2022 06/20/17 0522  NA 137  --   K 3.9  --   CL 106  --   CO2 22  --   GLUCOSE 108*  --   BUN 11  --   CREATININE 0.70 0.67  CALCIUM 8.8*  --    PT/INR No results for input(s): LABPROT, INR in the last 72 hours. ABG No results for input(s): PHART, HCO3 in the last 72 hours.  Invalid input(s): PCO2, PO2  Studies/Results: Ct Abdomen Pelvis W Contrast  Addendum Date: 06/20/2017   ADDENDUM REPORT: 06/20/2017 00:42 ADDENDUM: Possible gallbladder wall thickening versus adjacent fluid. If gallbladder pathology is suspected, could correlate with right upper quadrant ultrasound Electronically Signed   By: Donavan Foil M.D.   On: 06/20/2017 00:42   Result Date: 06/20/2017 CLINICAL DATA:  Appendectomy on Wednesday with persistent lower abdominal pain EXAM: CT ABDOMEN AND PELVIS WITH CONTRAST TECHNIQUE: Multidetector CT imaging of the abdomen and pelvis was performed using the standard protocol following bolus administration of intravenous  contrast. CONTRAST:  38mL ISOVUE-370 IOPAMIDOL (ISOVUE-370) INJECTION 76% COMPARISON:  06/14/2017 FINDINGS: Lower chest: Development of small bilateral pleural effusions some of which is sub pulmonic. Partial consolidations within the bilateral lower lobes, atelectasis versus pneumonia. Atelectasis at the right middle lobe. Normal heart size. Partially visualized bilateral breast implants. Small amount of pericardial effusion at the cardiac base. Hepatobiliary: Prominence of the central intra hepatic ducts. No focal abnormality. No calcified gallstones. Possible gallbladder wall thickening or mild adjacent fluid. Pancreas: Unremarkable. No pancreatic ductal dilatation or surrounding inflammatory changes. Spleen: Normal in size without focal abnormality. Adrenals/Urinary Tract: Adrenal glands are unremarkable. Kidneys are normal, without renal calculi, focal lesion, or hydronephrosis. Bladder is unremarkable. Stomach/Bowel: Stomach is decompressed. Possible thickening of the gastric antrum. No dilated small bowel. Fluid-filled nonenlarged small bowel. Interval appendectomy. Thickened loops of distal small bowel within the pelvis. Vascular/Lymphatic: Nonaneurysmal aorta.  No significant adenopathy Reproductive: Status post hysterectomy. No adnexal masses. Other: No free air. Small amount of free fluid in the upper abdomen. Mildly rim enhancing posterior pelvic fluid collection measuring 9.1 by 6.5 cm. Additional small mildly rim enhancing fluid collections within the pelvis interspersed amongst the small bowel. Musculoskeletal: Degenerative changes at L5-S1 IMPRESSION: 1. Interval appendectomy. 9.1 x 6.5 cm mildly rim enhancing fluid collection in the posterior pelvis, could reflect organizing postoperative fluid collection versus developing infected fluid collection. Additional smaller mildly rim enhancing fluid collections in the pelvis interspersed amongst small bowel. 2. Development  of small bilateral pleural  effusions. Partial consolidations in the bilateral lower lobes, atelectasis versus pneumonia 3. Small amount of free fluid in the upper abdomen. Thickened appearance of distal small bowel loops within the pelvis, likely reactive ileitis 4. Possible mild wall thickening of the distal stomach, question gastritis or peptic ulcer disease. Electronically Signed: By: Donavan Foil M.D. On: 06/20/2017 00:34   Ct Image Guided Drainage Percut Cath  Peritoneal Retroperit  Result Date: 06/20/2017 CLINICAL DATA:  Appendicitis post appendectomy. New rim enhancing pelvic fluid collection. Drainage requested. EXAM: CT GUIDED DRAINAGE OF PELVIC ABSCESS ANESTHESIA/SEDATION: Intravenous Fentanyl and Versed were administered as conscious sedation during continuous monitoring of the patient's level of consciousness and physiological / cardiorespiratory status by the radiology RN, with a total moderate sedation time of 20 minutes. PROCEDURE: The procedure, risks, benefits, and alternatives were explained to the patient. Questions regarding the procedure were encouraged and answered. The patient understands and consents to the procedure. Patient placed prone. Select axial scans through the pelvis obtained. The cul-de-sacs collection was localized and an appropriate skin entry site was determined and marked. The operative field was prepped with chlorhexidinein a sterile fashion, and a sterile drape was applied covering the operative field. A sterile gown and sterile gloves were used for the procedure. Local anesthesia was provided with 1% Lidocaine. Under CT fluoroscopic guidance, an 18 gauge trocar needle was advanced into the collection. Thin cloudy fluid could be aspirated. An Amplatz guidewire advanced easily, its position confirmed on CT. Tract dilated to facilitate placement of a 12 French pigtail catheter, formed centrally within the collection. CT confirmed appropriate positioning. 50 mL thin cloudy fluid were aspirated, sent  for Gram stain and culture. Catheter secured externally with 0 Prolene suture and StatLock and placed to gravity drain bag. The patient tolerated the procedure well. COMPLICATIONS: None immediate FINDINGS: Cul-de-sac fluid collection was again localized. 12 French drain catheter placed as above. A sample of the thin cloudy aspirate was sent for Gram stain and culture. IMPRESSION: 1. Technically successful CT-guided pelvic abscess drain catheter placement Electronically Signed   By: Lucrezia Europe M.D.   On: 06/20/2017 13:57    Anti-infectives: Anti-infectives (From admission, onward)   Start     Dose/Rate Route Frequency Ordered Stop   06/20/17 1400  ciprofloxacin (CIPRO) IVPB 400 mg     400 mg 200 mL/hr over 60 Minutes Intravenous Every 12 hours 06/20/17 0116     06/20/17 0900  metroNIDAZOLE (FLAGYL) IVPB 500 mg     500 mg 100 mL/hr over 60 Minutes Intravenous Every 8 hours 06/20/17 0117     06/20/17 0100  ciprofloxacin (CIPRO) IVPB 400 mg     400 mg 200 mL/hr over 60 Minutes Intravenous  Once 06/20/17 0049 06/20/17 0358   06/20/17 0100  metroNIDAZOLE (FLAGYL) IVPB 500 mg     500 mg 100 mL/hr over 60 Minutes Intravenous  Once 06/20/17 0049 06/20/17 0205      Assessment/Plan:  Pelvic abscess after appy now s/p drain. Developed secondary ileus For now keep on clears A/Bs  Ambulate No need for surgical intervention at this time  Caroleen Hamman, MD, [facility]  06/21/2017

## 2017-06-22 DIAGNOSIS — T8149XA Infection following a procedure, other surgical site, initial encounter: Secondary | ICD-10-CM

## 2017-06-22 LAB — CBC
HEMATOCRIT: 31 % — AB (ref 35.0–47.0)
HEMOGLOBIN: 10.8 g/dL — AB (ref 12.0–16.0)
MCH: 32.2 pg (ref 26.0–34.0)
MCHC: 34.9 g/dL (ref 32.0–36.0)
MCV: 92.5 fL (ref 80.0–100.0)
Platelets: 438 10*3/uL (ref 150–440)
RBC: 3.35 MIL/uL — ABNORMAL LOW (ref 3.80–5.20)
RDW: 12.6 % (ref 11.5–14.5)
WBC: 9.8 10*3/uL (ref 3.6–11.0)

## 2017-06-22 LAB — COMPREHENSIVE METABOLIC PANEL
ALBUMIN: 2.3 g/dL — AB (ref 3.5–5.0)
ALK PHOS: 58 U/L (ref 38–126)
ALT: 14 U/L (ref 14–54)
AST: 13 U/L — ABNORMAL LOW (ref 15–41)
Anion gap: 9 (ref 5–15)
BUN: 6 mg/dL (ref 6–20)
CALCIUM: 8.6 mg/dL — AB (ref 8.9–10.3)
CO2: 22 mmol/L (ref 22–32)
CREATININE: 0.75 mg/dL (ref 0.44–1.00)
Chloride: 106 mmol/L (ref 101–111)
GFR calc non Af Amer: >60 mL/min (ref >60)
GLUCOSE: 100 mg/dL — AB (ref 65–99)
Potassium: 3.5 mmol/L (ref 3.5–5.1)
SODIUM: 137 mmol/L (ref 135–145)
Total Bilirubin: 0.5 mg/dL (ref 0.3–1.2)
Total Protein: 5.2 g/dL — ABNORMAL LOW (ref 6.5–8.1)

## 2017-06-22 LAB — GRAM STAIN

## 2017-06-22 MED ORDER — METRONIDAZOLE 500 MG PO TABS: 500.0000 mg | ORAL_TABLET | Freq: Three times a day (TID) | ORAL | 0 refills | Status: AC

## 2017-06-22 MED ORDER — TRAMADOL HCL 50 MG PO TABS: 50.0000 mg | ORAL_TABLET | Freq: Four times a day (QID) | ORAL | Status: DC | PRN

## 2017-06-22 MED ORDER — CIPROFLOXACIN HCL 500 MG PO TABS: 500.0000 mg | ORAL_TABLET | Freq: Two times a day (BID) | ORAL | 0 refills | Status: AC

## 2017-06-22 NOTE — Progress Notes (Signed)
CC: Pelvic abscess Subjective: Feeling better, nausea improved, NO emesis. + BM. Pain is better. Wbc and creat normal  Objective: Vital signs in last 24 hours: Temp:  [98.1 F (36.7 C)-98.5 F (36.9 C)] 98.4 F (36.9 C) (05/16 0750) Pulse Rate:  [60-66] 61 (05/16 0750) Resp:  [20] 20 (05/16 0524) BP: (114-131)/(68-80) 122/68 (05/16 0750) SpO2:  [95 %-98 %] 95 % (05/16 0750) Last BM Date: 06/21/17  Intake/Output from previous day: 05/15 0701 - 05/16 0700 In: 3834.6 [P.O.:220; I.V.:3314.6; IV Piggyback:300] Out: 10 [Drains:10] Intake/Output this shift: No intake/output data recorded.  Physical exam:  Physical exam: NAD, alert Abd: soft, mildly distended incisions c/d/i, drain in place. No peritonitis Ext: no edema and well perfused    Lab Results: CBC  Recent Labs    06/20/17 0522 06/22/17 0550  WBC 12.3* 9.8  HGB 10.5* 10.8*  HCT 31.0* 31.0*  PLT 340 438   BMET Recent Labs    06/19/17 2022 06/20/17 0522 06/22/17 0550  NA 137  --  137  K 3.9  --  3.5  CL 106  --  106  CO2 22  --  22  GLUCOSE 108*  --  100*  BUN 11  --  6  CREATININE 0.70 0.67 0.75  CALCIUM 8.8*  --  8.6*   PT/INR No results for input(s): LABPROT, INR in the last 72 hours. ABG No results for input(s): PHART, HCO3 in the last 72 hours.  Invalid input(s): PCO2, PO2  Studies/Results: Dg Abd Acute W/chest  Result Date: 06/21/2017 CLINICAL DATA:  Appendectomy 7 days ago. Now with abdominal pain and ileus. Underwent CT directed abscess drainage yesterday. EXAM: DG ABDOMEN ACUTE W/ 1V CHEST COMPARISON:  Abdominal and pelvic CT scan of Jun 19, 2017 FINDINGS: The lungs are reasonably well inflated. There are bilateral pleural effusions layering inferiorly. There is bibasilar atelectasis. The heart and pulmonary vascularity are normal. The mediastinum is normal in width. Within the abdomen there is a moderate amount of gas within the stomach. There are loops of minimally distended gas-filled  small bowel in the mid and lower abdomen. No free extraluminal gas collections are observed. The pigtail drainage catheter tip projects just to the left of the lower sacrum. IMPRESSION: Mild small bowel ileus. No free extraluminal gas collections are observed. Bibasilar atelectasis and small bilateral pleural effusions not greatly changed from the earlier study. Electronically Signed   By: David  Martinique M.D.   On: 06/21/2017 12:18   Ct Image Guided Drainage Percut Cath  Peritoneal Retroperit  Result Date: 06/20/2017 CLINICAL DATA:  Appendicitis post appendectomy. New rim enhancing pelvic fluid collection. Drainage requested. EXAM: CT GUIDED DRAINAGE OF PELVIC ABSCESS ANESTHESIA/SEDATION: Intravenous Fentanyl and Versed were administered as conscious sedation during continuous monitoring of the patient's level of consciousness and physiological / cardiorespiratory status by the radiology RN, with a total moderate sedation time of 20 minutes. PROCEDURE: The procedure, risks, benefits, and alternatives were explained to the patient. Questions regarding the procedure were encouraged and answered. The patient understands and consents to the procedure. Patient placed prone. Select axial scans through the pelvis obtained. The cul-de-sacs collection was localized and an appropriate skin entry site was determined and marked. The operative field was prepped with chlorhexidinein a sterile fashion, and a sterile drape was applied covering the operative field. A sterile gown and sterile gloves were used for the procedure. Local anesthesia was provided with 1% Lidocaine. Under CT fluoroscopic guidance, an 18 gauge trocar needle was advanced into the  collection. Thin cloudy fluid could be aspirated. An Amplatz guidewire advanced easily, its position confirmed on CT. Tract dilated to facilitate placement of a 12 French pigtail catheter, formed centrally within the collection. CT confirmed appropriate positioning. 50 mL thin  cloudy fluid were aspirated, sent for Gram stain and culture. Catheter secured externally with 0 Prolene suture and StatLock and placed to gravity drain bag. The patient tolerated the procedure well. COMPLICATIONS: None immediate FINDINGS: Cul-de-sac fluid collection was again localized. 12 French drain catheter placed as above. A sample of the thin cloudy aspirate was sent for Gram stain and culture. IMPRESSION: 1. Technically successful CT-guided pelvic abscess drain catheter placement Electronically Signed   By: Lucrezia Europe M.D.   On: 06/20/2017 13:57    Anti-infectives: Anti-infectives (From admission, onward)   Start     Dose/Rate Route Frequency Ordered Stop   06/20/17 1400  ciprofloxacin (CIPRO) IVPB 400 mg     400 mg 200 mL/hr over 60 Minutes Intravenous Every 12 hours 06/20/17 0116     06/20/17 0900  metroNIDAZOLE (FLAGYL) IVPB 500 mg     500 mg 100 mL/hr over 60 Minutes Intravenous Every 8 hours 06/20/17 0117     06/20/17 0100  ciprofloxacin (CIPRO) IVPB 400 mg     400 mg 200 mL/hr over 60 Minutes Intravenous  Once 06/20/17 0049 06/20/17 0358   06/20/17 0100  metroNIDAZOLE (FLAGYL) IVPB 500 mg     500 mg 100 mL/hr over 60 Minutes Intravenous  Once 06/20/17 0049 06/20/17 0205      Assessment/Plan:  Pelvic abscess after appy now s/p drain. Developed secondary ileus and is improving Full liquids and advance as tolerated A/Bs  Ambulate No need for surgical intervention at this time DC in am if she continues to improve       Caroleen Hamman, MD, [facility]  06/22/2017

## 2017-06-22 NOTE — Progress Notes (Signed)
CC: Pelvic abscess  Subjective: Feeling better, nausea improved, NO emesis. + BM. Pain is better. Wbc and creat normalized She wishes to have full liquids   Objective: Vital signs in last 24 hours: Temp:  [98.1 F (36.7 C)-98.5 F (36.9 C)] 98.4 F (36.9 C) (05/16 0750) Pulse Rate:  [60-66] 61 (05/16 0750) Resp:  [20] 20 (05/16 0524) BP: (114-131)/(68-80) 122/68 (05/16 0750) SpO2:  [95 %-98 %] 95 % (05/16 0750) Last BM Date: 06/21/17  Intake/Output from previous day: 05/15 0701 - 05/16 0700 In: 3834.6 [P.O.:220; I.V.:3314.6; IV Piggyback:300] Out: 10 [Drains:10] Intake/Output this shift: Total I/O In: 877.9 [P.O.:480; I.V.:297.9; IV Piggyback:100] Out: -   Physical exam: NAD, alert Abd: soft, mildly distended incisions c/d/i, drain in place. No peritonitis Ext: no edema and well perfused   Lab Results: CBC  Recent Labs    06/20/17 0522 06/22/17 0550  WBC 12.3* 9.8  HGB 10.5* 10.8*  HCT 31.0* 31.0*  PLT 340 438   BMET Recent Labs    06/19/17 2022 06/20/17 0522 06/22/17 0550  NA 137  --  137  K 3.9  --  3.5  CL 106  --  106  CO2 22  --  22  GLUCOSE 108*  --  100*  BUN 11  --  6  CREATININE 0.70 0.67 0.75  CALCIUM 8.8*  --  8.6*   PT/INR No results for input(s): LABPROT, INR in the last 72 hours. ABG No results for input(s): PHART, HCO3 in the last 72 hours.  Invalid input(s): PCO2, PO2  Studies/Results: Dg Abd Acute W/chest  Result Date: 06/21/2017 CLINICAL DATA:  Appendectomy 7 days ago. Now with abdominal pain and ileus. Underwent CT directed abscess drainage yesterday. EXAM: DG ABDOMEN ACUTE W/ 1V CHEST COMPARISON:  Abdominal and pelvic CT scan of Jun 19, 2017 FINDINGS: The lungs are reasonably well inflated. There are bilateral pleural effusions layering inferiorly. There is bibasilar atelectasis. The heart and pulmonary vascularity are normal. The mediastinum is normal in width. Within the abdomen there is a moderate amount of gas within the  stomach. There are loops of minimally distended gas-filled small bowel in the mid and lower abdomen. No free extraluminal gas collections are observed. The pigtail drainage catheter tip projects just to the left of the lower sacrum. IMPRESSION: Mild small bowel ileus. No free extraluminal gas collections are observed. Bibasilar atelectasis and small bilateral pleural effusions not greatly changed from the earlier study. Electronically Signed   By: David  Martinique M.D.   On: 06/21/2017 12:18   Ct Image Guided Drainage Percut Cath  Peritoneal Retroperit  Result Date: 06/20/2017 CLINICAL DATA:  Appendicitis post appendectomy. New rim enhancing pelvic fluid collection. Drainage requested. EXAM: CT GUIDED DRAINAGE OF PELVIC ABSCESS ANESTHESIA/SEDATION: Intravenous Fentanyl and Versed were administered as conscious sedation during continuous monitoring of the patient's level of consciousness and physiological / cardiorespiratory status by the radiology RN, with a total moderate sedation time of 20 minutes. PROCEDURE: The procedure, risks, benefits, and alternatives were explained to the patient. Questions regarding the procedure were encouraged and answered. The patient understands and consents to the procedure. Patient placed prone. Select axial scans through the pelvis obtained. The cul-de-sacs collection was localized and an appropriate skin entry site was determined and marked. The operative field was prepped with chlorhexidinein a sterile fashion, and a sterile drape was applied covering the operative field. A sterile gown and sterile gloves were used for the procedure. Local anesthesia was provided with 1% Lidocaine. Under CT  fluoroscopic guidance, an 18 gauge trocar needle was advanced into the collection. Thin cloudy fluid could be aspirated. An Amplatz guidewire advanced easily, its position confirmed on CT. Tract dilated to facilitate placement of a 12 French pigtail catheter, formed centrally within the  collection. CT confirmed appropriate positioning. 50 mL thin cloudy fluid were aspirated, sent for Gram stain and culture. Catheter secured externally with 0 Prolene suture and StatLock and placed to gravity drain bag. The patient tolerated the procedure well. COMPLICATIONS: None immediate FINDINGS: Cul-de-sac fluid collection was again localized. 12 French drain catheter placed as above. A sample of the thin cloudy aspirate was sent for Gram stain and culture. IMPRESSION: 1. Technically successful CT-guided pelvic abscess drain catheter placement Electronically Signed   By: Lucrezia Europe M.D.   On: 06/20/2017 13:57    Anti-infectives: Anti-infectives (From admission, onward)   Start     Dose/Rate Route Frequency Ordered Stop   06/20/17 1400  ciprofloxacin (CIPRO) IVPB 400 mg     400 mg 200 mL/hr over 60 Minutes Intravenous Every 12 hours 06/20/17 0116     06/20/17 0900  metroNIDAZOLE (FLAGYL) IVPB 500 mg     500 mg 100 mL/hr over 60 Minutes Intravenous Every 8 hours 06/20/17 0117     06/20/17 0100  ciprofloxacin (CIPRO) IVPB 400 mg     400 mg 200 mL/hr over 60 Minutes Intravenous  Once 06/20/17 0049 06/20/17 0358   06/20/17 0100  metroNIDAZOLE (FLAGYL) IVPB 500 mg     500 mg 100 mL/hr over 60 Minutes Intravenous  Once 06/20/17 0049 06/20/17 0205      Assessment/Plan: Improving ileus Advance to full this am then to regular this pm If she continues to improve we will DC in am No need for surgical intervention    Caroleen Hamman, MD, FACS  06/22/2017

## 2017-06-22 NOTE — Discharge Summary (Signed)
Patient ID: Kathryn Pugh MRN: [id number] DOB/AGE: 1960/08/08 57 y.o.  Admit date: 06/19/2017 Discharge date: 06/22/2017   Discharge Diagnoses:  Active Problems:   Abscess after procedure   Ileus [facility])   Procedures: Placement of pelvic drain by IR  Hospital Course: 56 year old female status post appendectomy on May 8 by Dr. Burt Knack presented with lower abdominal pain and work-up revealed a pelvic abscess.  Patient was admitted to the hospital and interventional radiology was consulted for placement of a percutaneous drain.  She was started on broad-spectrum antibiotics and did well.  The drain put out seropurulent fluid and the patient also developed an ileus was managed with n.p.o. and slowly her diet was advanced from moderate clear liquid diet to a regular diet.  She continued to improve her white count normalized as well as her clinical condition.  The time of discharge she was ambulating, tolerating regular diet.  Her pain was controlled.  She was having bowel movements.  Physical exam at the time of discharge showed a female in no acute distress.  Awake and alert.  Abdomen: Soft incisions healing well no evidence of peritonitis or infection.  Drain in place.  I changed to a JP bulb.  Extremities: No edema well-perfused.  Condition of the patient at time of discharge was stable.  Please note that his pain over 35 minutes including preparing for the discharge, counseling the patient and seen the patient multiple times during the day. All questions were answered  Disposition: Discharge disposition: 01-Home or Self Care       Discharge Instructions    Call MD for:  difficulty breathing, headache or visual disturbances   Complete by:  As directed    Call MD for:  extreme fatigue   Complete by:  As directed    Call MD for:  hives   Complete by:  As directed    Call MD for:  persistant dizziness or light-headedness   Complete by:  As directed    Call MD for:  persistant nausea and  vomiting   Complete by:  As directed    Call MD for:  redness, tenderness, or signs of infection (pain, swelling, redness, odor or green/yellow discharge around incision site)   Complete by:  As directed    Call MD for:  severe uncontrolled pain   Complete by:  As directed    Call MD for:  temperature >100.4   Complete by:  As directed    Diet - low sodium heart healthy   Complete by:  As directed    Discharge instructions   Complete by:  As directed    Teach pt about Jp care   Increase activity slowly   Complete by:  As directed    Lifting restrictions   Complete by:  As directed    20 lbs x 4 weeks     Allergies as of 06/22/2017      Reactions   Phenergan [promethazine Hcl]       Medication List    STOP taking these medications   amoxicillin-clavulanate 875-125 MG tablet Commonly known as:  AUGMENTIN     TAKE these medications   ciprofloxacin 500 MG tablet Commonly known as:  CIPRO Take 1 tablet (500 mg total) by mouth 2 (two) times daily for 10 days.   HAIR/SKIN/NAILS 1250-7.5-7.5 MCG-MG-UNT Chew Generic drug:  Biotin w/ Vitamins C & E Chew by mouth as directed.   ibuprofen 600 MG tablet Commonly known as:  ADVIL,MOTRIN Take 1  tablet (600 mg total) by mouth every 8 (eight) hours as needed.   magnesium oxide 400 MG tablet Commonly known as:  MAG-OX Take 400 mg by mouth daily.   metoCLOPramide 10 MG tablet Commonly known as:  REGLAN Take 1 tablet (10 mg total) by mouth every 6 (six) hours as needed for nausea.   metroNIDAZOLE 500 MG tablet Commonly known as:  FLAGYL Take 1 tablet (500 mg total) by mouth 3 (three) times daily for 10 days.   oxyCODONE 5 MG immediate release tablet Commonly known as:  Oxy IR/ROXICODONE Take 1 tablet (5 mg total) by mouth every 6 (six) hours as needed for severe pain.   spironolactone 100 MG tablet Commonly known as:  ALDACTONE Take 100 mg by mouth daily.      Follow-up Information    Florene Glen, MD Follow up in  1 week(s).   Specialty:  General Surgery Contact information: [place] [address] [phone]            Caroleen Hamman, MD FACS

## 2017-06-22 NOTE — Progress Notes (Signed)
Kathryn Pugh to be D/C'd home per MD order.  Discussed prescriptions and follow up appointments with the patient. Prescriptions given to patient, medication list explained in detail. Pt verbalized understanding.  Allergies as of 06/22/2017      Reactions   Phenergan [promethazine Hcl]       Medication List    STOP taking these medications   amoxicillin-clavulanate 875-125 MG tablet Commonly known as:  AUGMENTIN     TAKE these medications   ciprofloxacin 500 MG tablet Commonly known as:  CIPRO Take 1 tablet (500 mg total) by mouth 2 (two) times daily for 10 days.   HAIR/SKIN/NAILS 1250-7.5-7.5 MCG-MG-UNT Chew Generic drug:  Biotin w/ Vitamins C & E Chew by mouth as directed.   ibuprofen 600 MG tablet Commonly known as:  ADVIL,MOTRIN Take 1 tablet (600 mg total) by mouth every 8 (eight) hours as needed.   magnesium oxide 400 MG tablet Commonly known as:  MAG-OX Take 400 mg by mouth daily.   metoCLOPramide 10 MG tablet Commonly known as:  REGLAN Take 1 tablet (10 mg total) by mouth every 6 (six) hours as needed for nausea.   metroNIDAZOLE 500 MG tablet Commonly known as:  FLAGYL Take 1 tablet (500 mg total) by mouth 3 (three) times daily for 10 days.   oxyCODONE 5 MG immediate release tablet Commonly known as:  Oxy IR/ROXICODONE Take 1 tablet (5 mg total) by mouth every 6 (six) hours as needed for severe pain.   spironolactone 100 MG tablet Commonly known as:  ALDACTONE Take 100 mg by mouth daily.       Vitals:   06/22/17 0524 06/22/17 0750  BP: 117/76 122/68  Pulse: 63 61  Resp: 20   Temp: 98.1 F (36.7 C) 98.4 F (36.9 C)  SpO2: 98% 95%    Skin clean, dry and intact without evidence of skin break down, no evidence of skin tears noted. IV catheter discontinued intact. Site without signs and symptoms of complications. Dressing and pressure applied. Pt denies pain at this time. No complaints noted.  An After Visit Summary was printed and given to the  patient. Patient escorted via [place], and D/C home via private auto. JP education provided.  Kathryn Pugh A

## 2017-06-22 NOTE — Progress Notes (Signed)
Pt having mild pain and is due for toradol but IV has infiltrated. While attempting a new IV will give oral pain meds. This RN attempted to administer prn oxycodone that was ordered but pt prefers something else. MD called who ordered prn tramadol which satisfied the patient. Will administered this med once verified.

## 2017-06-22 NOTE — Plan of Care (Signed)
Pt ambulating frequently. Drain in place to left abdomen/flank area. Minimal output. Tolerating advanced diet. No c/o pain. IV abx infusing.

## 2017-06-25 LAB — CULTURE, BODY FLUID W GRAM STAIN -BOTTLE: Culture: NO GROWTH

## 2017-06-25 LAB — CULTURE, BODY FLUID-BOTTLE

## 2017-06-26 ENCOUNTER — Encounter: Payer: Self-pay | Admitting: Surgery

## 2017-06-27 ENCOUNTER — Ambulatory Visit (INDEPENDENT_AMBULATORY_CARE_PROVIDER_SITE_OTHER): Payer: 59 | Admitting: Surgery

## 2017-06-27 ENCOUNTER — Encounter: Payer: Self-pay | Admitting: Surgery

## 2017-06-27 VITALS — BP 120/81 | HR 83 | Temp 98.1°F | Ht 64.0 in | Wt 114.8 lb

## 2017-06-27 DIAGNOSIS — T8149XA Infection following a procedure, other surgical site, initial encounter: Secondary | ICD-10-CM

## 2017-06-27 DIAGNOSIS — N739 Female pelvic inflammatory disease, unspecified: Secondary | ICD-10-CM

## 2017-06-27 DIAGNOSIS — Z4889 Encounter for other specified surgical aftercare: Secondary | ICD-10-CM

## 2017-06-27 NOTE — Patient Instructions (Addendum)
We removed your drain today. You will need to keep a dressing over the drain site until it heals completely.   Please finish all of the antibiotic.  If you feel much better then you may call and cancel your follow up appointment.

## 2017-06-27 NOTE — Progress Notes (Signed)
Surgical Clinic Progress/Follow-up Note   HPI:  57 y.o. Female presents to clinic for follow-up evaluation s/p image-guided IR drainage of post-appendectomy pelvic abscess. Patient reports some mild soreness at the trans-gluteal drain insertion site, but her primary complaint has been GI upset without diarrhea or emesis and food tasting metallic while taking her antibiotics. She otherwise reports +flatus and denies abdominal pain, fever/chills, CP, or SOB and describes consistently scant clear yellow to pink drainage without any purulent drainage.  Review of Systems:  Constitutional: denies fever/chills  Respiratory: denies shortness of breath, wheezing  Cardiovascular: denies chest pain, palpitations  Gastrointestinal: abdominal pain, N/V, and bowel function as per interval history Skin: Denies any other rashes or skin discolorations except post-surgical wounds as per interval history  Vital Signs:  BP 120/81   Pulse 83   Temp 98.1 F (36.7 C) (Oral)   Ht 5\' 4"  (1.626 m)   Wt 114 lb 12.8 oz (52.1 kg)   BMI 19.71 kg/m    Physical Exam:  Constitutional:  -- Normal body habitus  -- Awake, alert, and oriented x3  Pulmonary:  -- No crackles -- Equal breath sounds bilaterally -- Breathing non-labored at rest Cardiovascular:  -- S1, S2 present  -- No pericardial rubs  Gastrointestinal:  -- Soft and non-distended, and non-tender -- Post-surgical incisions all well-approximated without any peri-incisional erythema or drainage -- No abdominal masses appreciated, pulsatile or otherwise  Musculoskeletal / Integumentary:  -- Wounds or skin discoloration: None appreciated except post-surgical incisions as described above (GI) and Right buttock trans-gluteal drain well-secured without surrounding erythema and clear yellow and pink fluid in only the drain tubing -- Extremities: B/L UE and LE FROM, hands and feet warm, no edema   Assessment:  57 y.o. yo Female with a problem list  including...  Patient Active Problem List   Diagnosis Date Noted  . Ileus ([place])   . Abscess after procedure 06/20/2017  . Acute appendicitis   . 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 Acne 07/04/2014    presents to clinic for follow-up evaluation, doing overall well with only moderate GI upset attributable to her antibiotics without diarrhea, emesis, or abdominal pain s/p image-guided IR trans-gluteal drainage of post-appendectomy pelvic abscess.  Plan:              - diet as tolerated  - complete 4 remaining days of prescribed antibiotics             - okay to submerge incisions under water (baths, swimming) prn             - gradually resume all activities without restrictions             - apply sunblock particularly to incisions with sun exposure to reduce pigmentation of scars             - return to clinic in 1 week to reassess GI symptoms upon completion of antibiotics  - instructed to call office if any questions or concerns  All of the above recommendations were discussed with the patient, and all of patient's questions were answered to her expressed satisfaction.  -- Marilynne Drivers Rosana Hoes, MD, [place]: [facility] for exceptional care. Office: [phone]

## 2017-06-28 ENCOUNTER — Encounter: Payer: Self-pay | Admitting: Surgery

## 2017-07-04 ENCOUNTER — Encounter: Payer: Self-pay | Admitting: Surgery

## 2017-07-05 ENCOUNTER — Other Ambulatory Visit: Payer: Self-pay | Admitting: Internal Medicine

## 2017-07-05 ENCOUNTER — Ambulatory Visit
Admission: RE | Admit: 2017-07-05 | Discharge: 2017-07-05 | Disposition: A | Discharge status: Home / Self Care | Payer: 59 | Source: Ambulatory Visit | Attending: Internal Medicine | Admitting: Internal Medicine

## 2017-07-05 ENCOUNTER — Ambulatory Visit
Admission: RE | Admit: 2017-07-05 | Discharge: 2017-07-05 | Disposition: A | Discharge status: Home / Self Care | Payer: 59 | Source: Ambulatory Visit | Attending: Interventional Radiology | Admitting: Interventional Radiology

## 2017-07-05 ENCOUNTER — Other Ambulatory Visit (HOSPITAL_COMMUNITY): Payer: Self-pay | Admitting: Interventional Radiology

## 2017-07-05 DIAGNOSIS — R071 Chest pain on breathing: Secondary | ICD-10-CM

## 2017-07-05 DIAGNOSIS — Z9889 Other specified postprocedural states: Secondary | ICD-10-CM | POA: Insufficient documentation

## 2017-07-05 DIAGNOSIS — J90 Pleural effusion, not elsewhere classified: Secondary | ICD-10-CM

## 2017-07-05 DIAGNOSIS — J9811 Atelectasis: Secondary | ICD-10-CM | POA: Insufficient documentation

## 2017-07-05 LAB — BODY FLUID CELL COUNT WITH DIFFERENTIAL
Eos, Fluid: 2 %
Lymphs, Fluid: 23 %
Monocyte-Macrophage-Serous Fluid: 11 %
NEUTROPHIL FLUID: 64 %
OTHER CELLS FL: 0 %
Total Nucleated Cell Count, Fluid: 787 cu mm

## 2017-07-05 LAB — LACTATE DEHYDROGENASE, PLEURAL OR PERITONEAL FLUID: LD FL: 118 U/L — AB (ref 3–23)

## 2017-07-05 MED ORDER — IOPAMIDOL (ISOVUE-370) INJECTION 76%
75.0000 mL | Freq: Once | INTRAVENOUS | Status: AC | PRN
  Administered 2017-07-05: 75 mL via INTRAVENOUS

## 2017-07-06 LAB — PATHOLOGIST SMEAR REVIEW

## 2017-07-06 LAB — PH, BODY FLUID: PH, BODY FLUID: 7.5

## 2017-07-07 ENCOUNTER — Ambulatory Visit (INDEPENDENT_AMBULATORY_CARE_PROVIDER_SITE_OTHER): Payer: 59 | Admitting: Cardiothoracic Surgery

## 2017-07-07 ENCOUNTER — Encounter: Payer: Self-pay | Admitting: Cardiothoracic Surgery

## 2017-07-07 VITALS — BP 116/76 | HR 76 | Temp 98.7°F | Wt 107.0 lb

## 2017-07-07 DIAGNOSIS — J90 Pleural effusion, not elsewhere classified: Secondary | ICD-10-CM | POA: Diagnosis not present

## 2017-07-07 NOTE — Patient Instructions (Signed)
We will see you back in a month. Chest X-Ray prior to your appointment.

## 2017-07-07 NOTE — Progress Notes (Signed)
Patient ID: Kathryn Pugh, female   DOB: 11-27-1960, 57 y.o.   MRN: [id number]  Chief Complaint  Patient presents with  . New Patient (Initial Visit)    right pleural effusion with associated mild right lower lobe atelectasis    Referred By Dr. Frazier Richards Reason for Referral right pleural effusion  HPI Location, Quality, Duration, Severity, Timing, Context, Modifying Factors, Associated Signs and Symptoms.  Kathryn Pugh is a 57 y.o. female.  Kathryn Pugh has had a complicated history over the past 4 weeks.  She states that this began with a trip to [place] when she began experiencing increasing shortness of breath and cough.  The cough became so severe that she actually sought medical attention was placed on antibiotics.  Over the course of the next few days she developed signs and symptoms consistent with appendicitis and ultimately underwent a laparoscopic appendectomy in early May.  She did reasonably well after that but again continued to have some discomfort and her cough continued.  She had a nonproductive cough and had some low-grade fevers.  She was seen by Dr. Frazier Richards who prescribed a course of antibiotic therapy and a chest x-ray and CT scan were obtained.  This revealed a left pleural effusion.  She then underwent a ultrasound-guided thoracentesis.  Her chest x-ray afterwards showed only a small residual pleural effusion on the right.  She states that she has had no cough.  She is on day 3 of a 21-day course of amoxicillin.  She is scheduled to follow-up with Dr. Ola Spurr next week.  She denied any fevers.  She states that her appetite's been good.  She does have some discomfort on the right lower chest wall but this is improving.   Past Medical History:  Diagnosis Date  . Abscess after procedure 06/20/2017  . Acute appendicitis     Past Surgical History:  Procedure Laterality Date  . ABDOMINAL HYSTERECTOMY    . CESAREAN SECTION    . LAPAROSCOPIC APPENDECTOMY  N/A 06/14/2017   Procedure: APPENDECTOMY LAPAROSCOPIC;  Surgeon: Florene Glen, MD;  Location: [facility] ORS;  Service: General;  Laterality: N/A;    No family history on file.  Social History Social History   Tobacco Use  . Smoking status: Never Smoker  . Smokeless tobacco: Never Used  Substance Use Topics  . Alcohol use: Not Currently  . Drug use: Not Currently    Allergies  Allergen Reactions  . Phenergan [Promethazine Hcl]     Current Outpatient Medications  Medication Sig Dispense Refill  . Biotin w/ Vitamins C & E (HAIR/SKIN/NAILS) 1250-7.5-7.5 MCG-MG-UNT CHEW Chew by mouth as directed.    Marland Kitchen ibuprofen (ADVIL,MOTRIN) 600 MG tablet Take 1 tablet (600 mg total) by mouth every 8 (eight) hours as needed. 30 tablet 0  . magnesium oxide (MAG-OX) 400 MG tablet Take 400 mg by mouth daily.    . metoCLOPramide (REGLAN) 10 MG tablet Take 1 tablet (10 mg total) by mouth every 6 (six) hours as needed for nausea. 12 tablet 0  . oxyCODONE (OXY IR/ROXICODONE) 5 MG immediate release tablet Take 1 tablet (5 mg total) by mouth every 6 (six) hours as needed for severe pain. 30 tablet 0  . spironolactone (ALDACTONE) 100 MG tablet Take 100 mg by mouth daily.  11   No current facility-administered medications for this visit.       Review of Systems A complete review of systems was asked and was negative except for the following positive  findings some mild shortness of breath, low-grade fever intermittently.  Blood pressure 116/76, pulse 76, temperature 98.7 F (37.1 C), temperature source Oral, weight 107 lb (48.5 kg), SpO2 99 %.  Physical Exam CONSTITUTIONAL:  Pleasant, well-developed, well-nourished, and in no acute distress. EYES: Pupils equal and reactive to light, Sclera non-icteric EARS, NOSE, MOUTH AND THROAT:  The oropharynx was clear.  Dentition is good repair.  Oral mucosa pink and moist. LYMPH NODES:  Lymph nodes in the neck and axillae were normal RESPIRATORY:  Lungs were clear.   Normal respiratory effort without pathologic use of accessory muscles of respiration CARDIOVASCULAR: Heart was regular without murmurs.  There were no carotid bruits. GI: The abdomen was soft, nontender, and nondistended. There were no palpable masses. There was no hepatosplenomegaly. There were normal bowel sounds in all quadrants. GU:  Rectal deferred.   MUSCULOSKELETAL:  Normal muscle strength and tone.  No clubbing or cyanosis.   SKIN:  There were no pathologic skin lesions.  There were no nodules on palpation.  Her laparoscopic incisions were clean dry and intact. NEUROLOGIC:  Sensation is normal.  Cranial nerves are grossly intact. PSYCH:  Oriented to person, place and time.  Mood and affect are normal.  Data Reviewed I have independently reviewed her CT scans and chest x-rays as well as the results of her thoracentesis  I have personally reviewed the patient's imaging, laboratory findings and medical records.    Assessment    Right-sided pleural effusion status post ultrasound-guided thoracentesis and status post laparoscopic appendectomy    Plan    I had a long discussion with her today.  We reviewed all of her films as well as the results from the ultrasound-guided thoracentesis.  This does suggest an inflammatory exudate on the right side.  The cultures have been negative and there were no organisms seen on Gram stain.  She remains on antibiotic therapy.  I told her that I thought to be important to continue her antibiotics.  She will complete her 21-day course and follow-up with Dr. Ola Spurr next week.  I also asked her to come back and see me in 1 month with a repeat chest x-ray.  She understands that she is to call if she has any further questions.         Nestor Lewandowsky, MD 07/07/2017, 11:36 AM

## 2017-07-09 LAB — BODY FLUID CULTURE: Culture: NO GROWTH

## 2017-08-04 ENCOUNTER — Ambulatory Visit: Payer: Self-pay | Admitting: Cardiothoracic Surgery

## 2017-08-14 ENCOUNTER — Telehealth: Payer: Self-pay | Admitting: *Deleted

## 2017-08-14 NOTE — Telephone Encounter (Signed)
Patient did not want to r/s no showed appointment.

## 2017-08-14 NOTE — Telephone Encounter (Signed)
Called patient to scheduled her missed appt. Patient states she runs five miles daily and dose not  need to be seen.

## 2017-11-30 ENCOUNTER — Other Ambulatory Visit (HOSPITAL_COMMUNITY): Payer: Self-pay

## 2017-11-30 ENCOUNTER — Other Ambulatory Visit (HOSPITAL_BASED_OUTPATIENT_CLINIC_OR_DEPARTMENT_OTHER): Payer: Self-pay

## 2017-11-30 DIAGNOSIS — Z1231 Encounter for screening mammogram for malignant neoplasm of breast: Secondary | ICD-10-CM

## 2017-12-25 ENCOUNTER — Encounter (HOSPITAL_BASED_OUTPATIENT_CLINIC_OR_DEPARTMENT_OTHER): Payer: Self-pay

## 2017-12-25 ENCOUNTER — Ambulatory Visit (HOSPITAL_BASED_OUTPATIENT_CLINIC_OR_DEPARTMENT_OTHER)

## 2017-12-25 ENCOUNTER — Ambulatory Visit (HOSPITAL_BASED_OUTPATIENT_CLINIC_OR_DEPARTMENT_OTHER)
Admission: RE | Admit: 2017-12-25 | Discharge: 2017-12-25 | Disposition: A | Discharge status: Home / Self Care | Source: Ambulatory Visit

## 2017-12-25 ENCOUNTER — Other Ambulatory Visit (HOSPITAL_BASED_OUTPATIENT_CLINIC_OR_DEPARTMENT_OTHER): Payer: Self-pay

## 2017-12-25 DIAGNOSIS — Z1231 Encounter for screening mammogram for malignant neoplasm of breast: Secondary | ICD-10-CM | POA: Insufficient documentation

## 2017-12-25 DIAGNOSIS — Z0000 Encounter for general adult medical examination without abnormal findings: Secondary | ICD-10-CM

## 2017-12-25 DIAGNOSIS — R928 Other abnormal and inconclusive findings on diagnostic imaging of breast: Secondary | ICD-10-CM

## 2017-12-25 LAB — BASIC METABOLIC PANEL
ANION GAP: 8 mmol/L (ref 3–11)
BUN/CREA RATIO: 18 (ref 6–22)
BUN: 12 mg/dL (ref 6–20)
CALCIUM: 9.7 mg/dL (ref 8.6–10.3)
CHLORIDE: 102 mmol/L (ref 101–111)
CO2 TOTAL: 28 mmol/L (ref 22–32)
CREATININE: 0.67 mg/dL (ref 0.44–1.00)
ESTIMATED GFR: >60 mL/min/1.73mˆ2 (ref >60)
GLUCOSE: 104 mg/dL (ref 70–110)
POTASSIUM: 4.3 mmol/L (ref 3.4–5.1)
SODIUM: 138 mmol/L (ref 136–145)

## 2017-12-25 LAB — LIPID PANEL
CHOL/HDL RATIO: 2.4
CHOLESTEROL: 240 mg/dL — ABNORMAL HIGH (ref 120–199)
HDL CHOL: 100 mg/dL (ref >39)
LDL CALC: 128 mg/dL (ref <=130)
TRIGLYCERIDES: 58 mg/dL (ref <150)
VLDL CALC: 12 mg/dL (ref 5–35)

## 2017-12-25 LAB — THYROID STIMULATING HORMONE (SENSITIVE TSH): TSH: 0.859 u[IU]/mL (ref 0.340–5.330)

## 2018-10-17 ENCOUNTER — Other Ambulatory Visit: Payer: Self-pay

## 2018-11-26 IMAGING — CR DG CHEST 1V
1 series · 1 of 1 positions shown · non-contrast
Comparison: 07/05/2017

CLINICAL DATA: Status post right thoracentesis

EXAM:
CHEST  1 VIEW

[dg chest 1 view]
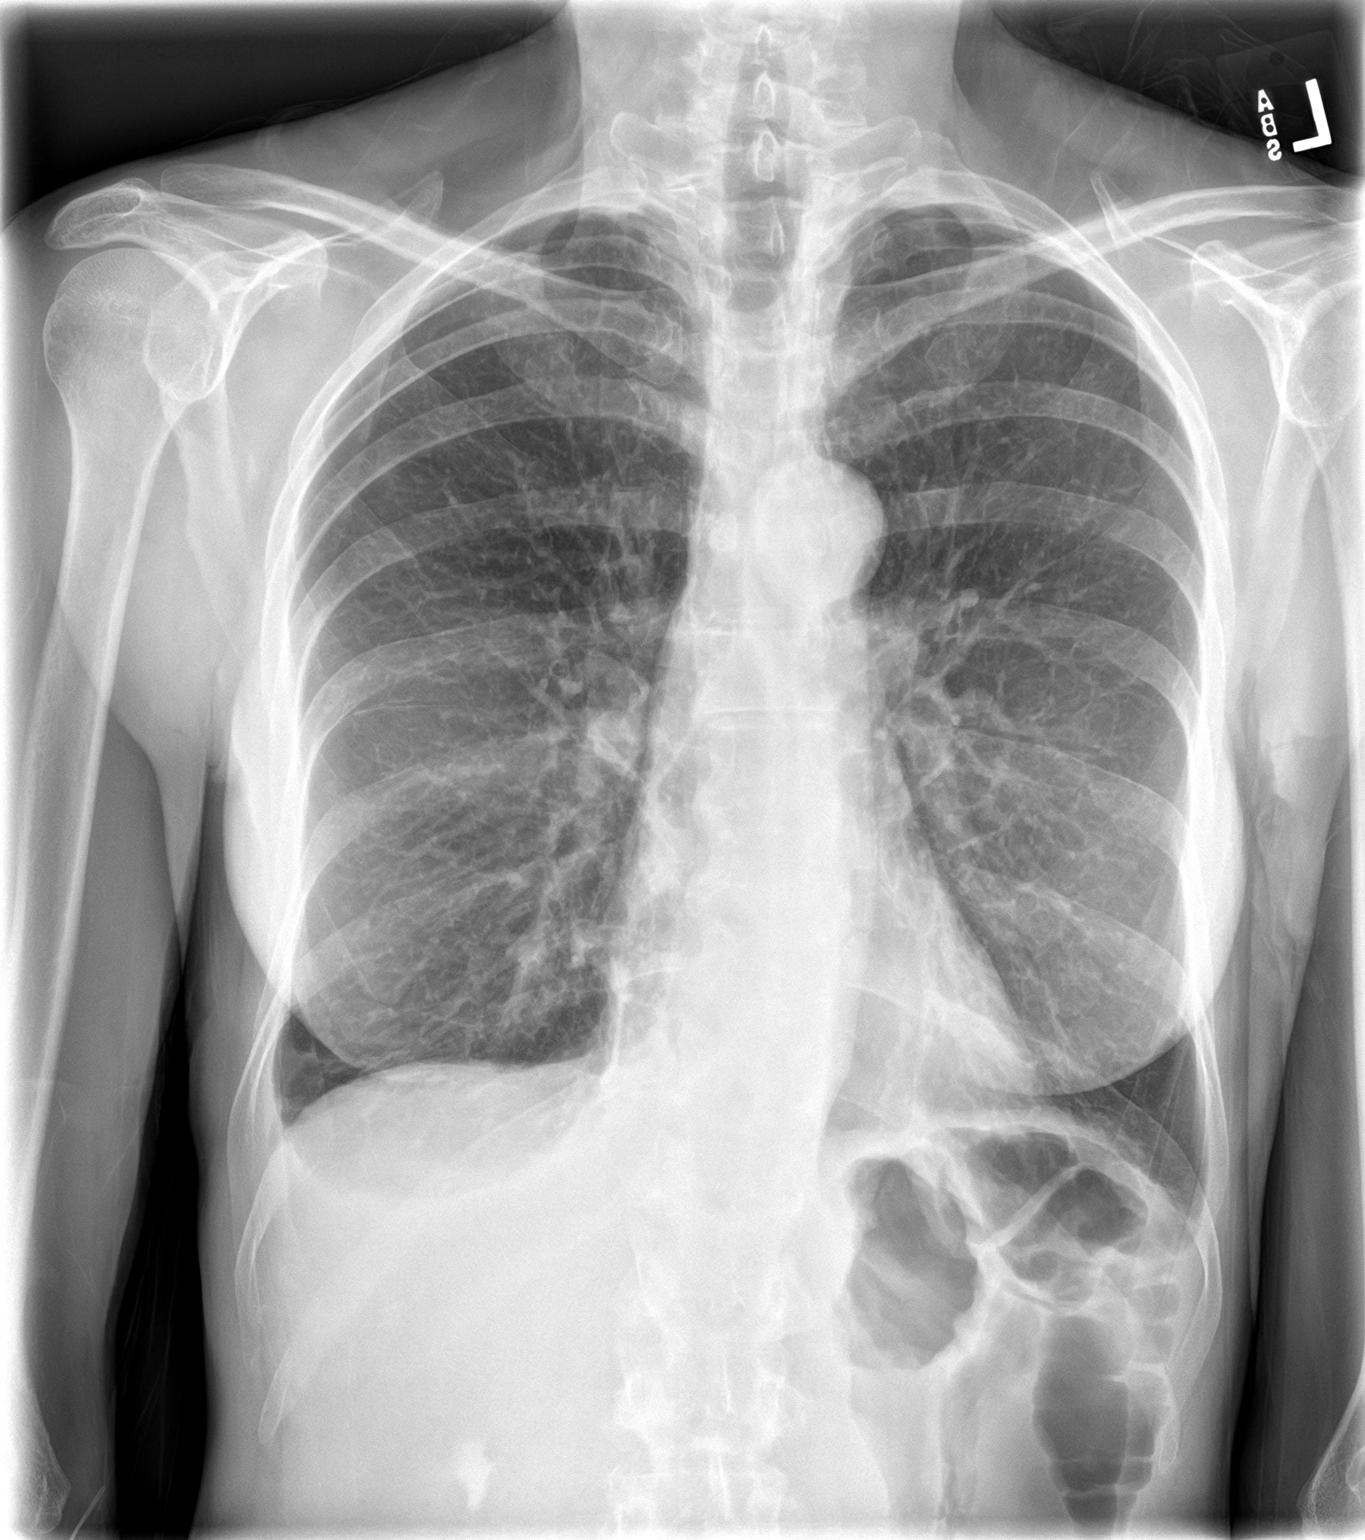

[1 of 1 positions shown; findings below may reference images not displayed]

FINDINGS: Trace residual right basilar effusion blunting the right
costophrenic angle. Minor residual basilar atelectasis. No focal
pneumonia, collapse or consolidation. No pneumothorax. Normal heart
size and vascularity. Trachea is midline. Degenerative changes noted
of the spine. Excreted contrast noted within the right collecting
system.
IMPRESSION: Negative for pneumothorax following right thoracentesis.

## 2019-03-03 ENCOUNTER — Encounter

## 2019-05-08 ENCOUNTER — Ambulatory Visit: Payer: 59 | Admitting: Dermatology

## 2019-05-15 ENCOUNTER — Ambulatory Visit: Payer: 59 | Admitting: Dermatology

## 2019-05-21 ENCOUNTER — Ambulatory Visit: Payer: 59

## 2019-06-12 ENCOUNTER — Ambulatory Visit (INDEPENDENT_AMBULATORY_CARE_PROVIDER_SITE_OTHER): Payer: Self-pay | Admitting: Dermatology

## 2019-06-12 ENCOUNTER — Other Ambulatory Visit: Payer: Self-pay

## 2019-06-12 DIAGNOSIS — L988 Other specified disorders of the skin and subcutaneous tissue: Secondary | ICD-10-CM

## 2019-06-12 NOTE — Progress Notes (Signed)
   Follow-Up Visit   Subjective  Kathryn Pugh is a 59 y.o. female who presents for the following: Facial Elastosis (face, pt presents for Botox today, last txt 47m).   The following portions of the chart were reviewed this encounter and updated as appropriate:  Tobacco  Allergies  Meds  Problems  Med Hx  Surg Hx  Fam Hx      Review of Systems:  No other skin or systemic complaints except as noted in HPI or Assessment and Plan.  Objective  Well appearing patient in no apparent distress; mood and affect are within normal limits.  A focused examination was performed including face. Relevant physical exam findings are noted in the Assessment and Plan.  Objective  face: Rhytides and volume loss.   Images                     Assessment & Plan  Elastosis of skin face  Botox 37.5 units injectioned today as marked.  Frown complex 25units Forehead 7.5units Chin 5units  Botox Injection - face Location: See attached image  Informed consent: Discussed risks (infection, pain, bleeding, bruising, swelling, allergic reaction, paralysis of nearby muscles, eyelid droop, double vision, neck weakness, difficulty breathing, headache, undesirable cosmetic result, and need for additional treatment) and benefits of the procedure, as well as the alternatives.  Informed consent was obtained.  Preparation: The area was cleansed with alcohol.  Procedure Details:  Botox was injected into the dermis with a 30-gauge needle. Pressure applied to any bleeding. Ice packs offered for swelling.  Lot Number:  [id number] Expiration:  08/2021  Total Units Injected:  37.5  Plan: Patient was instructed to remain upright for 4 hours. Patient was instructed to avoid massaging the face and avoid vigorous exercise for the rest of the day. Tylenol may be used for headache.  Allow 2 weeks before returning to clinic for additional dosing as needed. Patient will call for any  problems.   Return in about 3 weeks (around 07/03/2019) for botox r/u.    Documentation: I have reviewed the above documentation for accuracy and completeness, and I agree with the above.  Sarina Ser, MD   I, [name], RMA, am acting as scribe for Sarina Ser, MD .

## 2019-06-13 ENCOUNTER — Encounter: Payer: Self-pay | Admitting: Dermatology

## 2019-07-03 ENCOUNTER — Ambulatory Visit: Payer: 59 | Admitting: Dermatology

## 2019-07-15 ENCOUNTER — Ambulatory Visit: Payer: 59

## 2019-08-22 ENCOUNTER — Ambulatory Visit: Payer: 59 | Admitting: Dermatology

## 2019-12-27 ENCOUNTER — Other Ambulatory Visit: Payer: Self-pay

## 2020-04-06 ENCOUNTER — Ambulatory Visit: Payer: 59 | Admitting: Dermatology

## 2021-03-16 ENCOUNTER — Ambulatory Visit (INDEPENDENT_AMBULATORY_CARE_PROVIDER_SITE_OTHER): Payer: 59 | Admitting: Dermatology

## 2021-03-16 ENCOUNTER — Other Ambulatory Visit: Payer: Self-pay

## 2021-03-16 DIAGNOSIS — L814 Other melanin hyperpigmentation: Secondary | ICD-10-CM

## 2021-03-16 DIAGNOSIS — C44619 Basal cell carcinoma of skin of left upper limb, including shoulder: Secondary | ICD-10-CM

## 2021-03-16 DIAGNOSIS — C4491 Basal cell carcinoma of skin, unspecified: Secondary | ICD-10-CM

## 2021-03-16 DIAGNOSIS — L820 Inflamed seborrheic keratosis: Secondary | ICD-10-CM | POA: Diagnosis not present

## 2021-03-16 DIAGNOSIS — L578 Other skin changes due to chronic exposure to nonionizing radiation: Secondary | ICD-10-CM | POA: Diagnosis not present

## 2021-03-16 DIAGNOSIS — D492 Neoplasm of unspecified behavior of bone, soft tissue, and skin: Secondary | ICD-10-CM

## 2021-03-16 HISTORY — DX: Basal cell carcinoma of skin, unspecified: C44.91

## 2021-03-16 NOTE — Patient Instructions (Addendum)

## 2021-03-16 NOTE — Progress Notes (Signed)
Follow-Up Visit   Subjective  Kathryn Pugh is a 61 y.o. female who presents for the following: check spots (L arm, noticed 2m ango, no symptoms) and Nevus (L abdomen, >10yr, has gotten darker). The patient has spots, moles and lesions to be evaluated, some may be new or changing and the patient has concerns that these could be cancer.  The following portions of the chart were reviewed this encounter and updated as appropriate:   Tobacco   Allergies   Meds   Problems   Med Hx   Surg Hx   Fam Hx      Review of Systems:  No other skin or systemic complaints except as noted in HPI or Assessment and Plan.  Objective  Well appearing patient in no apparent distress; mood and affect are within normal limits.  A focused examination was performed including Larm, abdomen. Relevant physical exam findings are noted in the Assessment and Plan.  L proximal lat bicep Pink patch 1.1cm     LUQA x 1, R temple hairline x 1 (2) Stuck on waxy paps with erythema    Assessment & Plan  Neoplasm of skin L proximal lat bicep  Epidermal / dermal shaving  Lesion diameter (cm):  1.1 Informed consent: discussed and consent obtained   Timeout: patient name, date of birth, surgical site, and procedure verified   Procedure prep:  Patient was prepped and draped in usual sterile fashion Prep type:  Isopropyl alcohol Anesthesia: the lesion was anesthetized in a standard fashion   Anesthetic:  1% lidocaine w/ epinephrine 1-100,000 buffered w/ 8.4% NaHCO3 Instrument used: flexible razor blade   Hemostasis achieved with: pressure, aluminum chloride and electrodesiccation   Outcome: patient tolerated procedure well   Post-procedure details: sterile dressing applied and wound care instructions given   Dressing type: bandage and bacitracin    Destruction of lesion Complexity: extensive   Destruction method: electrodesiccation and curettage   Informed consent: discussed and consent obtained   Timeout:   patient name, date of birth, surgical site, and procedure verified Procedure prep:  Patient was prepped and draped in usual sterile fashion Prep type:  Isopropyl alcohol Anesthesia: the lesion was anesthetized in a standard fashion   Anesthetic:  1% lidocaine w/ epinephrine 1-100,000 buffered w/ 8.4% NaHCO3 Curettage performed in three different directions: Yes   Electrodesiccation performed over the curetted area: Yes   Lesion length (cm):  1.1 Lesion width (cm):  1.1 Margin per side (cm):  0.2 Final wound size (cm):  1.5 Hemostasis achieved with:  pressure, aluminum chloride and electrodesiccation Outcome: patient tolerated procedure well with no complications   Post-procedure details: sterile dressing applied and wound care instructions given   Dressing type: bandage and bacitracin    Specimen 1 - Surgical pathology Differential Diagnosis: D48.5 BCC vs other  Check Margins: No Pink patch 1.1cm EDC today  Inflamed seborrheic keratosis (2) LUQA x 1, R temple hairline x 1  Destruction of lesion - LUQA x 1, R temple hairline x 1 Complexity: simple   Destruction method: cryotherapy   Informed consent: discussed and consent obtained   Timeout:  patient name, date of birth, surgical site, and procedure verified Lesion destroyed using liquid nitrogen: Yes   Region frozen until ice ball extended beyond lesion: Yes   Outcome: patient tolerated procedure well with no complications   Post-procedure details: wound care instructions given    Actinic Damage - chronic, secondary to cumulative UV radiation exposure/sun exposure over time - diffuse  scaly erythematous macules with underlying dyspigmentation - Recommend daily broad spectrum sunscreen SPF 30+ to sun-exposed areas, reapply every 2 hours as needed.  - Recommend staying in the shade or wearing long sleeves, sun glasses (UVA+UVB protection) and wide brim hats (4-inch brim around the entire circumference of the hat). - Call for new  or changing lesions.  Lentigines - Scattered tan macules - Due to sun exposure - Benign-appering, observe - Recommend daily broad spectrum sunscreen SPF 30+ to sun-exposed areas, reapply every 2 hours as needed. - Call for any changes  Return in about 3 months (around 06/13/2021) for TBSE, Hx of SCC, Hx of Dysplastic nevi.  I, Kathryn Pugh, RMA, am acting as scribe for Sarina Ser, MD . Documentation: I have reviewed the above documentation for accuracy and completeness, and I agree with the above.  Sarina Ser, MD

## 2021-03-17 ENCOUNTER — Telehealth: Payer: Self-pay

## 2021-03-17 ENCOUNTER — Encounter: Payer: Self-pay | Admitting: Dermatology

## 2021-03-17 NOTE — Telephone Encounter (Signed)
-----   Message from Ralene Bathe, MD sent at 03/17/2021  5:35 PM EST ----- Diagnosis Skin , left proximal lat bicep SUPERFICIAL BASAL CELL CARCINOMA, BASE INVOLVED  Cancer - BCC Superficial As suspected Already treated Recheck next visit

## 2021-03-17 NOTE — Telephone Encounter (Signed)
Left pt message to call for bx results/sh °

## 2021-04-06 ENCOUNTER — Telehealth: Payer: Self-pay

## 2021-04-06 NOTE — Telephone Encounter (Signed)
-----   Message from Ralene Bathe, MD sent at 03/17/2021  5:35 PM EST ----- Diagnosis Skin , left proximal lat bicep SUPERFICIAL BASAL CELL CARCINOMA, BASE INVOLVED  Cancer - BCC Superficial As suspected Already treated Recheck next visit

## 2021-04-06 NOTE — Telephone Encounter (Signed)
Left message on voicemail to return my call.  

## 2021-05-06 ENCOUNTER — Ambulatory Visit: Payer: Self-pay | Admitting: Dermatology

## 2021-06-16 ENCOUNTER — Ambulatory Visit: Payer: 59 | Admitting: Dermatology

## 2021-08-31 ENCOUNTER — Encounter (INDEPENDENT_AMBULATORY_CARE_PROVIDER_SITE_OTHER): Payer: Self-pay | Admitting: Family

## 2021-08-31 ENCOUNTER — Ambulatory Visit (INDEPENDENT_AMBULATORY_CARE_PROVIDER_SITE_OTHER): Admitting: Family

## 2021-08-31 VITALS — BP 113/72 | HR 77 | Temp 97.1°F | Resp 12 | Ht 62.0 in | Wt 127.0 lb

## 2021-08-31 DIAGNOSIS — Z0000 Encounter for general adult medical examination without abnormal findings: Secondary | ICD-10-CM

## 2021-08-31 DIAGNOSIS — Z1239 Encounter for other screening for malignant neoplasm of breast: Secondary | ICD-10-CM

## 2021-08-31 DIAGNOSIS — Z1211 Encounter for screening for malignant neoplasm of colon: Secondary | ICD-10-CM

## 2021-08-31 NOTE — Progress Notes (Signed)
[facility]  [address]  [place] [address]  Phone: [phone]  Fax: [phone]    Encounter Date: 08/31/2021    Patient ID:  Julie Davidson  TIW:[id number]    DOB: October 27, 1960  Age: 61 y.o. female    Subjective:     Chief Complaint   Patient presents with   . New Patient   . Blood Work   . Physical   . Establish Care     HPI   61 y/o female with no significant PMH seen today to establish care  She is healthy for the most part  Work requires that she gets a physical done  Denies tobacco use  Occasional ETOH user   No current outpatient medications on file.     Allergies   Allergen Reactions   . Sulfa (Sulfonamides)      History reviewed. No pertinent past medical history.    Past Surgical History:   Procedure Laterality Date   . HX BREAST BIOPSY Right     Benign      Family Medical History:     Problem Relation (Age of Onset)    No Known Problems Mother, Father        Social History     Tobacco Use   . Smoking status: Never   Substance Use Topics   . Alcohol use: Not Currently   . Drug use: Never     Review of Systems   Constitutional: Negative for activity change, appetite change, chills, diaphoresis, fatigue, fever and unexpected weight change.   HENT: Negative for congestion, dental problem, drooling, ear discharge, ear pain, facial swelling, hearing loss, mouth sores, nosebleeds, postnasal drip, rhinorrhea, sinus pressure, sinus pain, sneezing, sore throat, tinnitus, trouble swallowing and voice change.    Eyes: Negative for photophobia, pain, discharge, redness, itching and visual disturbance.   Respiratory: Negative for apnea, cough, choking, chest tightness, wheezing and stridor.    Cardiovascular: Negative for chest pain, palpitations and leg swelling.   Gastrointestinal: Negative for abdominal distention, abdominal pain, anal bleeding, blood in stool, constipation, diarrhea, nausea, rectal pain and vomiting.   Endocrine: Negative for cold intolerance,  heat intolerance, polydipsia, polyphagia and polyuria.   Genitourinary: Negative for decreased urine volume, difficulty urinating, dysuria, enuresis, frequency, genital sores, hematuria and urgency.   Musculoskeletal: Negative for arthralgias, back pain, gait problem, joint swelling, myalgias, neck pain and neck stiffness.   Skin: Negative for color change, pallor, rash and wound.   Allergic/Immunologic: Negative for environmental allergies, food allergies and immunocompromised state.   Neurological: Negative for dizziness, tremors, seizures, syncope, facial asymmetry, speech difficulty, weakness, light-headedness, numbness and headaches.   Psychiatric/Behavioral: Negative for agitation, behavioral problems, confusion, decreased concentration, dysphoric mood, hallucinations, self-injury, sleep disturbance and suicidal ideas. The patient is not nervous/anxious and is not hyperactive.      Objective:   Vitals: BP 113/72   Pulse 77   Temp 36.2 C (97.1 F)   Resp 12   Ht 1.575 m (5\' 2" )   Wt 57.6 kg (127 lb)   SpO2 99%   BMI 23.23 kg/m     Physical Exam  Constitutional:       General: She is not in acute distress.     Appearance: Normal appearance. She is not ill-appearing, toxic-appearing or diaphoretic.   HENT:      Head: Normocephalic and atraumatic.      Nose: Nose normal.      Mouth/Throat:  Mouth: Mucous membranes are moist.      Pharynx: No oropharyngeal exudate or posterior oropharyngeal erythema.   Eyes:      General: No scleral icterus.        Right eye: No discharge.         Left eye: No discharge.      Extraocular Movements: Extraocular movements intact.      Conjunctiva/sclera: Conjunctivae normal.      Pupils: Pupils are equal, round, and reactive to light.   Neck:      Vascular: No carotid bruit.   Cardiovascular:      Rate and Rhythm: Normal rate and regular rhythm.      Pulses: Normal pulses.      Heart sounds: Normal heart sounds. No murmur heard.    No friction rub. No gallop.    Pulmonary:      Effort: Pulmonary effort is normal. No respiratory distress.      Breath sounds: Normal breath sounds. No stridor. No wheezing, rhonchi or rales.   Chest:      Chest wall: No tenderness.   Abdominal:      General: Bowel sounds are normal.      Palpations: Abdomen is soft. There is no mass.      Tenderness: There is no abdominal tenderness. There is no right CVA tenderness, left CVA tenderness, guarding or rebound.      Hernia: No hernia is present.   Musculoskeletal:         General: No swelling, tenderness, deformity or signs of injury. Normal range of motion.      Cervical back: Normal range of motion and neck supple. No rigidity or tenderness.      Right lower leg: No edema.      Left lower leg: No edema.   Lymphadenopathy:      Cervical: No cervical adenopathy.   Skin:     General: Skin is warm and dry.      Capillary Refill: Capillary refill takes less than 2 seconds.      Coloration: Skin is not jaundiced or pale.      Findings: No bruising, erythema or lesion.   Neurological:      General: No focal deficit present.      Mental Status: She is alert and oriented to person, place, and time.      Cranial Nerves: No cranial nerve deficit.      Sensory: No sensory deficit.      Motor: No weakness.      Coordination: Coordination normal.      Gait: Gait normal.      Deep Tendon Reflexes: Reflexes normal.   Psychiatric:         Mood and Affect: Mood normal.         Behavior: Behavior normal.         Thought Content: Thought content normal.         Judgment: Judgment normal.         Assessment & Plan:     ENCOUNTER DIAGNOSES     ICD-10-CM   1. Well adult exam  Z00.00   2. Encounter for screening for malignant neoplasm of breast, unspecified screening modality  Z12.39   3. Screening for colon cancer  Z12.11     Continue with current plan of care  Medications reconciled and renewed   Mammogram- scheduled  Will schedule for pap smear  Cologuard as ordered   healthy lifestyle with diet and  exercise  Encouraged fruits and vegetables and whole grains as tolerated   Call with questions and concerns  F/U as scheduled     Choua Chalker, APRN,NP-C

## 2021-09-07 ENCOUNTER — Other Ambulatory Visit: Payer: Self-pay

## 2021-09-07 ENCOUNTER — Other Ambulatory Visit: Attending: Family

## 2021-09-07 DIAGNOSIS — Z0000 Encounter for general adult medical examination without abnormal findings: Secondary | ICD-10-CM | POA: Insufficient documentation

## 2021-09-07 LAB — CBC WITH DIFF
BASOPHIL #: <0.1 10*3/uL (ref <=0.20)
BASOPHIL %: 1 %
EOSINOPHIL #: <0.1 10*3/uL (ref <=0.50)
EOSINOPHIL %: 1 %
HCT: 38.1 % (ref 34.8–46.0)
HGB: 12.7 g/dL (ref 11.5–16.0)
IMMATURE GRANULOCYTE #: <0.1 10*3/uL (ref <0.10)
IMMATURE GRANULOCYTE %: 0 % (ref 0–1)
LYMPHOCYTE #: 2.07 10*3/uL (ref 1.00–4.80)
LYMPHOCYTE %: 48 %
MCH: 30.3 pg (ref 26.0–32.0)
MCHC: 33.3 g/dL (ref 31.0–35.5)
MCV: 90.9 fL (ref 78.0–100.0)
MONOCYTE #: 0.32 10*3/uL (ref 0.20–1.10)
MONOCYTE %: 7 %
MPV: 8.7 fL (ref 8.7–12.5)
NEUTROPHIL #: 1.9 10*3/uL (ref 1.50–7.70)
NEUTROPHIL %: 43 %
PLATELETS: 347 10*3/uL (ref 150–400)
RBC: 4.19 10*6/uL (ref 3.85–5.22)
RDW-CV: 11.5 % (ref 11.5–15.5)
WBC: 4.4 10*3/uL (ref 3.7–11.0)

## 2021-09-07 LAB — COMPREHENSIVE METABOLIC PANEL, NON-FASTING
ALBUMIN: 4.3 g/dL (ref 3.4–4.8)
ALKALINE PHOSPHATASE: 61 U/L (ref 50–130)
ALT (SGPT): 17 U/L (ref 8–22)
ANION GAP: 8 mmol/L (ref 4–13)
AST (SGOT): 24 U/L (ref 8–45)
BILIRUBIN TOTAL: 0.9 mg/dL (ref 0.3–1.3)
BUN/CREA RATIO: 11 (ref 6–22)
BUN: 8 mg/dL (ref 8–25)
CALCIUM: 9.7 mg/dL (ref 8.8–10.2)
CHLORIDE: 102 mmol/L (ref 96–111)
CO2 TOTAL: 27 mmol/L (ref 23–31)
CREATININE: 0.73 mg/dL (ref 0.60–1.05)
ESTIMATED GFR: >90 mL/min/BSA (ref >=60)
GLUCOSE: 90 mg/dL (ref 65–125)
POTASSIUM: 3.8 mmol/L (ref 3.5–5.1)
PROTEIN TOTAL: 7.4 g/dL (ref 6.0–8.0)
SODIUM: 137 mmol/L (ref 136–145)

## 2021-09-07 LAB — LIPID PANEL
CHOL/HDL RATIO: 2.8
CHOLESTEROL: 233 mg/dL — ABNORMAL HIGH (ref 100–200)
HDL CHOL: 82 mg/dL (ref >=50)
LDL CALC: 133 mg/dL — ABNORMAL HIGH (ref <100)
NON-HDL: 151 mg/dL (ref <=190)
TRIGLYCERIDES: 102 mg/dL (ref <150)
VLDL CALC: 18 mg/dL (ref <30)

## 2021-09-07 LAB — HGA1C (HEMOGLOBIN A1C WITH EST AVG GLUCOSE)
ESTIMATED AVERAGE GLUCOSE: 111 mg/dL
HEMOGLOBIN A1C: 5.5 % (ref 4.0–5.6)

## 2021-09-07 LAB — THYROID STIMULATING HORMONE (SENSITIVE TSH): TSH: 0.705 u[IU]/mL (ref 0.430–3.550)

## 2021-09-20 LAB — COLOGUARD® COLON CANCER SCREEN: COLOGUARD RESULT: NEGATIVE

## 2021-09-20 LAB — LAB: COLOGUARD RESULT: NEGATIVE

## 2021-09-21 ENCOUNTER — Encounter (INDEPENDENT_AMBULATORY_CARE_PROVIDER_SITE_OTHER): Admitting: Family

## 2021-09-28 ENCOUNTER — Ambulatory Visit (INDEPENDENT_AMBULATORY_CARE_PROVIDER_SITE_OTHER): Admitting: Family

## 2021-09-28 ENCOUNTER — Encounter (INDEPENDENT_AMBULATORY_CARE_PROVIDER_SITE_OTHER): Payer: Self-pay | Admitting: Family

## 2021-09-28 VITALS — BP 122/70 | HR 68 | Temp 97.0°F | Resp 12 | Ht 62.0 in | Wt 129.0 lb

## 2021-09-28 DIAGNOSIS — Z0000 Encounter for general adult medical examination without abnormal findings: Secondary | ICD-10-CM

## 2021-09-28 NOTE — Progress Notes (Signed)
[facility]  [address]  [place] [address]  Phone: [phone]  Fax: [phone]    Encounter Date: 09/28/2021    Patient ID:  Julie Davidson  ACZ:[id number]    DOB: 1960/07/24  Age: 61 y.o. female    Subjective:     Chief Complaint   Patient presents with    Follow Up    Lab Results     HPI   61 y/o female with no significant PMH seen today for F/U visit  She is doing well, she denies any changes in her health since she was last seen   Denies recent ER/Hospital visit    No current outpatient medications on file.     Allergies   Allergen Reactions    Sulfa (Sulfonamides)      History reviewed. No pertinent past medical history.    Past Surgical History:   Procedure Laterality Date    HX BREAST BIOPSY Right     Benign      Family Medical History:       Problem Relation (Age of Onset)    No Known Problems Mother, Father          Social History     Tobacco Use    Smoking status: Never   Substance Use Topics    Alcohol use: Not Currently    Drug use: Never     Review of Systems   Constitutional:  Negative for activity change, appetite change, chills, diaphoresis, fatigue, fever and unexpected weight change.   HENT:  Negative for congestion, dental problem, drooling, ear discharge, ear pain, facial swelling, hearing loss, mouth sores, nosebleeds, postnasal drip, rhinorrhea, sinus pressure, sinus pain, sneezing, sore throat, tinnitus, trouble swallowing and voice change.    Eyes:  Negative for photophobia, pain, discharge, redness, itching and visual disturbance.   Respiratory:  Negative for apnea, cough, choking, chest tightness, wheezing and stridor.    Cardiovascular:  Negative for chest pain, palpitations and leg swelling.   Gastrointestinal:  Negative for abdominal distention, abdominal pain, anal bleeding, blood in stool, constipation, diarrhea, nausea, rectal pain and vomiting.   Endocrine: Negative for cold intolerance, heat intolerance, polydipsia, polyphagia  and polyuria.   Genitourinary:  Negative for decreased urine volume, difficulty urinating, dysuria, enuresis, frequency, genital sores, hematuria and urgency.   Musculoskeletal:  Negative for arthralgias, back pain, gait problem, joint swelling, myalgias, neck pain and neck stiffness.   Skin:  Negative for color change, pallor, rash and wound.   Allergic/Immunologic: Negative for environmental allergies, food allergies and immunocompromised state.   Neurological:  Negative for dizziness, tremors, seizures, syncope, facial asymmetry, speech difficulty, weakness, light-headedness, numbness and headaches.   Psychiatric/Behavioral:  Negative for agitation, behavioral problems, confusion, decreased concentration, dysphoric mood, hallucinations, self-injury, sleep disturbance and suicidal ideas. The patient is not nervous/anxious and is not hyperactive.    Objective:   Vitals: BP 122/70   Pulse 68   Temp 36.1 C (97 F)   Resp 12   Ht 1.575 m (5\' 2" )   Wt 58.5 kg (129 lb)   SpO2 99%   BMI 23.59 kg/m     Physical Exam  Constitutional:       General: She is not in acute distress.     Appearance: Normal appearance. She is not ill-appearing, toxic-appearing or diaphoretic.   HENT:      Head: Normocephalic and atraumatic.      Nose: Nose normal.      Mouth/Throat:  Mouth: Mucous membranes are moist.      Pharynx: No oropharyngeal exudate or posterior oropharyngeal erythema.   Eyes:      General: No scleral icterus.        Right eye: No discharge.         Left eye: No discharge.      Extraocular Movements: Extraocular movements intact.      Conjunctiva/sclera: Conjunctivae normal.      Pupils: Pupils are equal, round, and reactive to light.   Neck:      Vascular: No carotid bruit.   Cardiovascular:      Rate and Rhythm: Normal rate and regular rhythm.      Pulses: Normal pulses.      Heart sounds: Normal heart sounds. No murmur heard.    No friction rub. No gallop.   Pulmonary:      Effort: Pulmonary effort is  normal. No respiratory distress.      Breath sounds: Normal breath sounds. No stridor. No wheezing, rhonchi or rales.   Chest:      Chest wall: No tenderness.   Abdominal:      General: Bowel sounds are normal.      Palpations: Abdomen is soft. There is no mass.      Tenderness: There is no abdominal tenderness. There is no right CVA tenderness, left CVA tenderness, guarding or rebound.      Hernia: No hernia is present.   Musculoskeletal:         General: No swelling, tenderness, deformity or signs of injury. Normal range of motion.      Cervical back: Normal range of motion and neck supple. No rigidity or tenderness.      Right lower leg: No edema.      Left lower leg: No edema.   Lymphadenopathy:      Cervical: No cervical adenopathy.   Skin:     General: Skin is warm and dry.      Capillary Refill: Capillary refill takes less than 2 seconds.      Coloration: Skin is not jaundiced or pale.      Findings: No bruising, erythema or lesion.   Neurological:      General: No focal deficit present.      Mental Status: She is alert and oriented to person, place, and time.      Cranial Nerves: No cranial nerve deficit.      Sensory: No sensory deficit.      Motor: No weakness.      Coordination: Coordination normal.      Gait: Gait normal.      Deep Tendon Reflexes: Reflexes normal.   Psychiatric:         Mood and Affect: Mood normal.         Behavior: Behavior normal.         Thought Content: Thought content normal.         Judgment: Judgment normal.     Assessment & Plan:     ENCOUNTER DIAGNOSES     ICD-10-CM   1. Well adult exam  Z00.00     I reviewed most recent labs  Cologuard is negative   healthy lifestyle with diet and exercise  Encouraged fruits and vegetables and whole grains as tolerated   Call with questions and concerns  Advised on strict return precautions  F/U as scheduled     Julie Illescas, APRN,NP-C

## 2021-11-02 ENCOUNTER — Inpatient Hospital Stay
Admission: RE | Admit: 2021-11-02 | Discharge: 2021-11-02 | Disposition: A | Discharge status: Home / Self Care | Source: Ambulatory Visit | Attending: Family | Admitting: Family

## 2021-11-02 ENCOUNTER — Other Ambulatory Visit: Payer: Self-pay

## 2021-11-02 DIAGNOSIS — Z978 Presence of other specified devices: Secondary | ICD-10-CM

## 2021-11-02 DIAGNOSIS — Z1231 Encounter for screening mammogram for malignant neoplasm of breast: Secondary | ICD-10-CM | POA: Insufficient documentation

## 2021-11-02 DIAGNOSIS — Z1239 Encounter for other screening for malignant neoplasm of breast: Secondary | ICD-10-CM

## 2021-11-15 ENCOUNTER — Ambulatory Visit: Payer: 59 | Admitting: Dermatology

## 2021-11-17 ENCOUNTER — Encounter
Admission: RE | Admit: 2021-11-17 | Discharge: 2021-11-17 | Disposition: A | Discharge status: Home / Self Care | Payer: 59 | Source: Ambulatory Visit | Attending: Specialist | Admitting: Specialist

## 2021-11-17 HISTORY — DX: Acne, unspecified: L70.9

## 2021-11-17 HISTORY — DX: Acute embolism and thrombosis of unspecified deep veins of unspecified lower extremity: I82.409

## 2021-11-17 NOTE — Patient Instructions (Addendum)
Your procedure is scheduled on:11-25-21 Thursday Report to the Registration Desk on the 1st floor of the [facility].Then proceed to the 2nd floor Surgery Desk To find out your arrival time, please call [phone] between 1PM - 3PM on:11-24-21 Wednesday If your arrival time is 6:00 am, do not arrive prior to that time as the [facility] entrance doors do not open until 6:00 am.  REMEMBER: Instructions that are not followed completely may result in serious medical risk, up to and including death; or upon the discretion of your surgeon and anesthesiologist your surgery may need to be rescheduled.  Do not eat food after midnight the night before surgery.  No gum chewing, lozengers or hard candies.  You may however, drink CLEAR liquids up to 2 hours before you are scheduled to arrive for your surgery. Do not drink anything within 2 hours of your scheduled arrival time.  Clear liquids include: - water  - apple juice without pulp - gatorade (not RED colors) - black coffee or tea (Do NOT add milk or creamers to the coffee or tea) Do NOT drink anything that is not on this list.  Do NOT take any medication the day of surgery  One week prior to surgery: Stop Anti-inflammatories (NSAIDS) such as Advil, Aleve, Ibuprofen, Motrin, Naproxen, Naprosyn and Aspirin based products such as Excedrin, Goodys Powder, BC Powder.You may however, continue to take Tylenol if needed for pain up until the day of surgery.  Stop ANY OVER THE COUNTER supplements/vitamins NOW (11-17-21) until after surgery (Magnesium)  No Alcohol for 24 hours before or after surgery.  No Smoking including e-cigarettes for 24 hours prior to surgery.  No chewable tobacco products for at least 6 hours prior to surgery.  No nicotine patches on the day of surgery.  Do not use any "recreational" drugs for at least a week prior to your surgery.  Please be advised that the combination of cocaine and anesthesia may have negative  outcomes, up to and including death. If you test positive for cocaine, your surgery will be cancelled.  On the morning of surgery brush your teeth with toothpaste and water, you may rinse your mouth with mouthwash if you wish. Do not swallow any toothpaste or mouthwash.  Use CHG Soap as directed on instruction sheet.  Do not wear jewelry, make-up, hairpins, clips or nail polish.  Do not wear lotions, powders, or perfumes.   Do not shave body from the neck down 48 hours prior to surgery just in case you cut yourself which could leave a site for infection.  Also, freshly shaved skin may become irritated if using the CHG soap.  Contact lenses, hearing aids and dentures may not be worn into surgery.  Do not bring valuables to the hospital. [facility] is not responsible for any missing/lost belongings or valuables.   Notify your doctor if there is any change in your medical condition (cold, fever, infection).  Wear comfortable clothing (specific to your surgery type) to the hospital.  After surgery, you can help prevent lung complications by doing breathing exercises.  Take deep breaths and cough every 1-2 hours. Your doctor may order a device called an Incentive Spirometer to help you take deep breaths. When coughing or sneezing, hold a pillow firmly against your incision with both hands. This is called "splinting." Doing this helps protect your incision. It also decreases belly discomfort.  If you are being admitted to the hospital overnight, leave your suitcase in the car. After surgery  it may be brought to your room.  If you are being discharged the day of surgery, you will not be allowed to drive home. You will need a responsible adult (18 years or older) to drive you home and stay with you that night.   If you are taking public transportation, you will need to have a responsible adult (18 years or older) with you. Please confirm with your physician that it is acceptable to use  public transportation.   Please call the [facility] Dept. at [phone] if you have any questions about these instructions.  Surgery Visitation Policy:  Patients undergoing a surgery or procedure may have two family members or support persons with them as long as the person is not COVID-19 positive or experiencing its symptoms.

## 2021-11-18 NOTE — H&P (Signed)
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 Symptoms are rated as moderate to severe, and have been worsening.  She has failed all conservative treatments including medication injections and splinting.  X-rays show severe degenerative arthritis.  This is significantly impairing activities of daily living.  She has elected for surgical management.   Past Medical History:  Diagnosis Date   Abscess after procedure 06/20/2017   Acne    Acute appendicitis    Atypical nevus 06/29/2015   right mid to low back   Basal cell carcinoma 03/16/2021   L proximal lat bicep, [facility]   DVT (deep venous thrombosis) (HCC)    Dysplastic nevus 01/24/2008   left lower leg   Dysplastic nevus 06/26/2008   left prox med volar forearm, left side lat costal area 12.0 cm above waistline post, left side lat costal area 12.0 cm above waistline ant   Dysplastic nevus 05/08/2014   right inf tip of scapula, right low back, right mid back lat braline, right mid back lat braline inf   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   R parasternal   Past Surgical History:  Procedure Laterality Date   ABDOMINAL HYSTERECTOMY     CESAREAN SECTION     LAPAROSCOPIC APPENDECTOMY N/A 06/14/2017   Procedure: APPENDECTOMY LAPAROSCOPIC;  Surgeon: Florene Glen, MD;  Location: [facility] ORS;  Service: General;  Laterality: N/A;   Social History   Socioeconomic History   Marital status: Married    Spouse name: Not on file   Number of children: Not on file   Years of education: Not on file   Highest education level: Not on file  Occupational History   Not on file  Tobacco Use   Smoking status: Never   Smokeless tobacco: Never  Vaping Use   Vaping Use: Never used  Substance and Sexual Activity    Alcohol use: Yes    Comment: occ   Drug use: Not Currently   Sexual activity: Not on file  Other Topics Concern   Not on file  Social History Narrative   Not on file   Social Determinants of Health   Financial Resource Strain: Not on file  Food Insecurity: Not on file  Transportation Needs: Not on file  Physical Activity: Not on file  Stress: Not on file  Social Connections: Not on file   No family history on file. Allergies  Allergen Reactions   Phenergan [Promethazine Hcl]    Prior to Admission medications   Medication Sig Start Date End Date Taking? Authorizing Provider  magnesium oxide (MAG-OX) 400 MG tablet Take 400 mg by mouth daily.    [provider]     Positive ROS: All other systems have been reviewed and were otherwise negative with the exception of those mentioned in the HPI and as above.  Physical Exam: General: Alert, no acute distress Cardiovascular: No pedal edema. Heart is regular and without murmur.  Respiratory: No cyanosis, no use of accessory musculature. Lungs are clear. GI: No organomegaly, abdomen is soft and non-tender Skin: No lesions in the area of chief complaint Neurologic: Sensation intact distally Psychiatric: Patient is competent for consent with normal mood and affect Lymphatic: No axillary or cervical lymphadenopathy  MUSCULOSKELETAL: Severe pain with grind test right thumb CMC joint.  There is subluxation of the metacarpal.  Her pinch is quite weak.  Neurovascular status is otherwise.  Median nerve compression is negative.  Skin is intact.  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   Park Breed, MD [phone]   11/18/2021 7:56 AM

## 2021-11-25 ENCOUNTER — Encounter: Admission: RE | Payer: Self-pay | Source: Home / Self Care

## 2021-11-25 ENCOUNTER — Ambulatory Visit: Admission: RE | Admit: 2021-11-25 | Payer: 59 | Source: Home / Self Care | Admitting: Specialist

## 2021-11-25 SURGERY — CARPOMETACARPAL (CMC) FUSION OF THUMB
Anesthesia: General | Site: Thumb | Laterality: Right

## 2021-12-16 ENCOUNTER — Ambulatory Visit: Admit: 2021-12-16 | Payer: 59 | Admitting: Specialist

## 2021-12-16 SURGERY — CARPOMETACARPAL (CMC) FUSION OF THUMB
Anesthesia: General | Site: Thumb | Laterality: Left

## 2022-06-09 ENCOUNTER — Encounter (INDEPENDENT_AMBULATORY_CARE_PROVIDER_SITE_OTHER): Payer: Self-pay | Admitting: Family Medicine

## 2022-06-09 ENCOUNTER — Ambulatory Visit (INDEPENDENT_AMBULATORY_CARE_PROVIDER_SITE_OTHER): Admitting: Family Medicine

## 2022-06-09 DIAGNOSIS — L0390 Cellulitis, unspecified: Secondary | ICD-10-CM

## 2022-06-09 DIAGNOSIS — T22299A Burn of second degree of multiple sites of unspecified shoulder and upper limb, except wrist and hand, initial encounter: Secondary | ICD-10-CM

## 2022-06-09 MED ORDER — BACITRACIN ZINC 500 UNIT/GRAM TOPICAL OINTMENT
TOPICAL_OINTMENT | Freq: Two times a day (BID) | CUTANEOUS | 1 refills | Status: AC | PRN
Start: 2022-06-09 — End: ?

## 2022-06-09 MED ORDER — AMOXICILLIN 875 MG-POTASSIUM CLAVULANATE 125 MG TABLET
1.00 | ORAL_TABLET | Freq: Two times a day (BID) | ORAL | 0 refills | Status: AC
Start: 2022-06-09 — End: 2022-06-16
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
## 2022-06-16 ENCOUNTER — Encounter (INDEPENDENT_AMBULATORY_CARE_PROVIDER_SITE_OTHER): Payer: Self-pay | Admitting: Family Medicine

## 2022-06-16 ENCOUNTER — Ambulatory Visit (INDEPENDENT_AMBULATORY_CARE_PROVIDER_SITE_OTHER): Admitting: Family Medicine

## 2022-06-16 VITALS — BP 104/71 | HR 73 | Temp 97.2°F | Resp 12 | Ht 62.0 in | Wt 128.0 lb

## 2022-06-16 DIAGNOSIS — T22299A Burn of second degree of multiple sites of unspecified shoulder and upper limb, except wrist and hand, initial encounter: Secondary | ICD-10-CM

## 2022-06-16 DIAGNOSIS — L0390 Cellulitis, unspecified: Secondary | ICD-10-CM
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
## 2022-07-07 ENCOUNTER — Encounter (INDEPENDENT_AMBULATORY_CARE_PROVIDER_SITE_OTHER): Payer: Self-pay | Admitting: Family Medicine

## 2023-06-22 ENCOUNTER — Ambulatory Visit (INDEPENDENT_AMBULATORY_CARE_PROVIDER_SITE_OTHER): Payer: Self-pay | Admitting: Family Medicine

## 2023-06-22 ENCOUNTER — Other Ambulatory Visit: Attending: Family Medicine

## 2023-06-22 ENCOUNTER — Other Ambulatory Visit: Payer: Self-pay

## 2023-06-22 VITALS — BP 118/74 | HR 65 | Temp 98.0°F | Resp 12 | Ht 62.0 in | Wt 124.0 lb

## 2023-06-22 DIAGNOSIS — Z1231 Encounter for screening mammogram for malignant neoplasm of breast: Secondary | ICD-10-CM

## 2023-06-22 DIAGNOSIS — Z0000 Encounter for general adult medical examination without abnormal findings: Secondary | ICD-10-CM

## 2023-06-22 DIAGNOSIS — Z01419 Encounter for gynecological examination (general) (routine) without abnormal findings: Secondary | ICD-10-CM | POA: Insufficient documentation

## 2023-06-22 DIAGNOSIS — Z1211 Encounter for screening for malignant neoplasm of colon: Secondary | ICD-10-CM

## 2023-06-22 DIAGNOSIS — N952 Postmenopausal atrophic vaginitis: Secondary | ICD-10-CM

## 2023-06-22 NOTE — Progress Notes (Addendum)
 [facility]  [address]  [place] [address]    Progress Note       Encounter Date: 06/22/2023    Patient ID:  Julie Davidson  WUJ:[id number]    DOB: [birth year]  Age: 63 y.o. female  Subjective   Subjective:     Chief Complaint   Patient presents with    Annual Pap    Lab Results     Mammogram question      Referral     Mammogram   Colonoscopy      HPI    This patient is a 63 y.o. female who presents for a routine Pap smear.     She reports her last Pap test was "a while" ago, with no problems during previous tests. The patient has a history of two miscarriages and one live birth. She is not currently sexually active and denies any history of sexually transmitted diseases. Her last menstrual period occurred in her early fifties, at least 10 years ago. She denies experiencing hot flashes or vaginal dryness. The patient is unsure of her last mammogram but estimates it was approximately 2 years ago.    The patient's medical history includes menopause onset in her early fifties, at least 10 years ago. She has a gravida 3, para 1 history with two miscarriages and one live birth (a son). Her family history notes a living son, though his age is not specified.    Regarding social history, the patient is not currently sexually active. Her obstetric history includes 2 miscarriages and 1 son. Menopause occurred in her early fifties, at least 10 years ago.    A review of systems reveals negative findings for vaginal dryness in the genitourinary system and negative for hot flashes in the endocrine system.    Current Outpatient Medications   Medication Sig    bacitracin  zinc  500 unit/gram Ointment Apply topically Twice per day as needed     Allergies   Allergen Reactions    Sulfa (Sulfonamides)      No past medical history on file.      Past Surgical History:   Procedure Laterality Date    HX BREAST BIOPSY Right     Benign          Family Medical History:       Problem Relation  (Age of Onset)    No Known Problems Mother, Father            Social History     Tobacco Use    Smoking status: Never   Substance Use Topics    Alcohol use: Not Currently    Drug use: Never       Review of Systems   Constitutional:  Negative for activity change, appetite change, chills, diaphoresis, fatigue, fever and unexpected weight change.   HENT:  Negative for congestion, dental problem, drooling, ear discharge, ear pain, facial swelling, hearing loss, mouth sores, nosebleeds, postnasal drip, rhinorrhea, sinus pressure, sinus pain, sneezing, sore throat, trouble swallowing and voice change.    Eyes:  Negative for photophobia, pain, discharge, redness and itching.   Respiratory:  Negative for apnea, cough, choking, chest tightness, shortness of breath, wheezing and stridor.    Cardiovascular:  Negative for chest pain, palpitations and leg swelling.   Gastrointestinal:  Negative for abdominal distention, abdominal pain, anal bleeding, blood in stool, constipation, diarrhea, nausea, rectal pain and vomiting.   Endocrine: Negative for cold intolerance, heat intolerance, polydipsia, polyphagia  and polyuria.   Genitourinary:  Negative for decreased urine volume, difficulty urinating, dyspareunia, dysuria, enuresis, flank pain, frequency, genital sores, hematuria, menstrual problem, pelvic pain, urgency and vaginal bleeding.   Musculoskeletal:  Negative for arthralgias, back pain, gait problem, joint swelling, myalgias, neck pain and neck stiffness.   Skin:  Negative for color change, pallor, rash and wound.   Allergic/Immunologic: Negative for environmental allergies, food allergies and immunocompromised state.   Neurological:  Negative for dizziness, tremors, seizures, syncope, facial asymmetry, speech difficulty, weakness, light-headedness, numbness and headaches.   Hematological:  Negative for adenopathy. Does not bruise/bleed easily.   Psychiatric/Behavioral:  Negative for agitation, behavioral problems,  confusion, decreased concentration, dysphoric mood, hallucinations, self-injury, sleep disturbance and suicidal ideas. The patient is not nervous/anxious and is not hyperactive.         Objective   Objective:   Vitals: BP 118/74 (Site: Left Arm, Patient Position: Sitting)   Pulse 65   Temp 36.7 C (98 F)   Resp 12   Ht 1.575 m (5\' 2" )   Wt 56.2 kg (124 lb)   SpO2 99%   BMI 22.68 kg/m       Physical Exam  Constitutional:       Appearance: Normal appearance. She is normal weight.   HENT:      Head: Normocephalic and atraumatic.      Nose: Nose normal.      Mouth/Throat:      Pharynx: Oropharynx is clear.   Eyes:      Extraocular Movements: Extraocular movements intact.      Conjunctiva/sclera: Conjunctivae normal.   Cardiovascular:      Rate and Rhythm: Normal rate and regular rhythm.      Pulses: Normal pulses.      Heart sounds: Normal heart sounds. No murmur heard.     No gallop.   Pulmonary:      Effort: Pulmonary effort is normal. No respiratory distress.      Breath sounds: Normal breath sounds.   Abdominal:      General: Bowel sounds are normal.   Genitourinary:     Vagina: Normal. No vaginal discharge.      Cervix: Normal.      Comments: Cervical os visualized   Musculoskeletal:         General: Normal range of motion.      Cervical back: Normal range of motion.   Lymphadenopathy:      Cervical: No cervical adenopathy.   Skin:     General: Skin is warm.   Neurological:      General: No focal deficit present.      Mental Status: She is alert and oriented to person, place, and time. Mental status is at baseline.   Psychiatric:         Mood and Affect: Mood normal.         Behavior: Behavior normal.         Thought Content: Thought content normal.         Judgment: Judgment normal.          Assessment & Plan:     ENCOUNTER DIAGNOSES     ICD-10-CM   1. Well woman exam  Z01.419   2. Breast cancer screening by mammogram  Z12.31   3. Colon cancer screening  Z12.11     Routine Gynecological Screening    -  Plan:    - Perform pap smear    - Communicate results via MyChart    -  Schedule follow-up in 2 weeks (virtual or in-person) to discuss results    - Plan annual follow-up if results are normal    - Educate patient on importance of reporting any postmenopausal bleeding    - Order Mammogram for breast cancer screening    Colon Cancer Screening    - Plan:    - Order colonoscopy for colon cancer screening    - Refer to gastroenterologist or surgeon for procedure    - Educate patient on colonoscopy preparation and procedure    - If results normal, repeat screening in 10 years    Orders Placed This Encounter    MAMMO BILATERAL SCREENING-ADDL VIEWS/BREAST US  AS REQ BY RAD    NEISSERIA GONORRHOEAE RNA, NAAT    Refer to External Provider    CYTOPATHOLOGY, GYN +/- HIGH RISK HPV       Return in about 1 year (around 06/21/2024).    Portions of this note may be dictated using voice recognition software or a dictation service. Variances in spelling and vocabulary are possible and unintentional. Not all errors are caught/corrected. Please notify the [facility] if any discrepancies are noted, or if the meaning of any statement is not clear.     I am scribing for, and in the presence of, Dr. Janee Mech for services provided on 06/22/2023.  Essie Hefty, MA

## 2023-06-23 ENCOUNTER — Encounter (HOSPITAL_COMMUNITY): Payer: Self-pay

## 2023-06-28 LAB — CYTOPATHOLOGY, GYN +/- HIGH RISK HPV

## 2023-07-05 LAB — HPV HIGH RISK REFLEX TO GENOTYPE - BMC/JMC ONLY: HPV RNA PCR: NEGATIVE

## 2023-07-06 ENCOUNTER — Encounter (INDEPENDENT_AMBULATORY_CARE_PROVIDER_SITE_OTHER): Payer: Self-pay | Admitting: Family Medicine

## 2023-07-10 ENCOUNTER — Ambulatory Visit: Attending: Family Medicine

## 2023-07-10 ENCOUNTER — Other Ambulatory Visit: Payer: Self-pay

## 2023-07-10 DIAGNOSIS — Z01419 Encounter for gynecological examination (general) (routine) without abnormal findings: Secondary | ICD-10-CM | POA: Insufficient documentation

## 2023-07-12 LAB — NEISSERIA GONORRHOEAE RNA, NAAT: NEISSERIA GONORRHEA GC RNA: NEGATIVE

## 2023-07-20 ENCOUNTER — Ambulatory Visit (INDEPENDENT_AMBULATORY_CARE_PROVIDER_SITE_OTHER): Admitting: Family Medicine

## 2023-07-20 ENCOUNTER — Other Ambulatory Visit: Payer: Self-pay

## 2023-07-20 ENCOUNTER — Encounter (INDEPENDENT_AMBULATORY_CARE_PROVIDER_SITE_OTHER): Payer: Self-pay | Admitting: Family Medicine

## 2023-07-20 VITALS — HR 65 | Temp 98.2°F | Resp 17 | Ht 62.0 in | Wt 125.0 lb

## 2023-07-20 DIAGNOSIS — R5383 Other fatigue: Secondary | ICD-10-CM

## 2023-07-20 DIAGNOSIS — Z0000 Encounter for general adult medical examination without abnormal findings: Secondary | ICD-10-CM

## 2023-07-20 NOTE — Progress Notes (Addendum)
 [facility]  [address]  [place] [address]    Progress Note       Encounter Date: 07/20/2023    Patient ID:  Gabbrielle Mcnicholas  WUJ:[id number]    DOB: 04-Feb-1961  Age: 63 y.o. female  Subjective   Subjective:     Chief Complaint   Patient presents with    Follow Up     Needs lab work done /needs urine test rest result     HPI    This patient is a 63 y.o. female who presents for an annual physical examination and follow-up from her last Pap smear. Her primary concerns are weight loss and improving her overall physical health.      The patient expresses a desire to improve her physical health and lose weight. She is seeking guidance on dietary changes and exercise routines to achieve her health goals. The patient has not reported any specific symptoms or complaints related to her weight or general health. She is interested in discussing opportunities to enhance her physical well-being through lifestyle modifications.    The patient is interested in improving physical health through a high-protein diet, minimizing starch, and increasing water intake to at least 8 glasses per day. We discussed strength training versus cardio for weight loss and physical health improvement.        Current Outpatient Medications   Medication Sig    bacitracin  zinc  500 unit/gram Ointment Apply topically Twice per day as needed     Allergies[1]  History reviewed. No pertinent past medical history.      Past Surgical History:   Procedure Laterality Date    HX BREAST BIOPSY Right     Benign          Family Medical History:       Problem Relation (Age of Onset)    No Known Problems Mother, Father            Social History[2]    Review of Systems   Constitutional:  Negative for activity change, appetite change, chills, diaphoresis, fatigue, fever and unexpected weight change.   HENT:  Negative for congestion, facial swelling, hearing loss, mouth sores, nosebleeds, postnasal drip, rhinorrhea,  sinus pressure, sinus pain, sneezing, sore throat, trouble swallowing and voice change.    Eyes:  Negative for photophobia, pain, discharge, redness and itching.   Respiratory:  Negative for apnea, cough, choking, chest tightness, shortness of breath, wheezing and stridor.    Cardiovascular:  Negative for chest pain, palpitations and leg swelling.   Gastrointestinal:  Negative for abdominal distention, abdominal pain, anal bleeding, blood in stool, constipation, diarrhea, nausea, rectal pain and vomiting.   Endocrine: Negative for cold intolerance, heat intolerance, polydipsia, polyphagia and polyuria.   Musculoskeletal:  Negative for arthralgias, back pain, gait problem, joint swelling, myalgias, neck pain and neck stiffness.   Skin:  Negative for color change, pallor, rash and wound.   Allergic/Immunologic: Negative for environmental allergies, food allergies and immunocompromised state.   Neurological:  Negative for dizziness, tremors, seizures, syncope, facial asymmetry, speech difficulty, weakness, light-headedness, numbness and headaches.   Hematological:  Negative for adenopathy. Does not bruise/bleed easily.   Psychiatric/Behavioral:  Negative for agitation, behavioral problems, confusion, decreased concentration, dysphoric mood, hallucinations, self-injury, sleep disturbance and suicidal ideas. The patient is not nervous/anxious and is not hyperactive.         Objective   Objective:   Vitals: Pulse 65   Temp 36.8 C (98.2 F)  Resp 17   Ht 1.575 m (5' 2)   Wt 56.7 kg (125 lb)   SpO2 98%   BMI 22.86 kg/m       Physical Exam  Constitutional:       Appearance: Normal appearance. She is normal weight.   HENT:      Head: Normocephalic and atraumatic.      Right Ear: Tympanic membrane, ear canal and external ear normal.      Left Ear: Tympanic membrane, ear canal and external ear normal.      Nose: Nose normal.      Mouth/Throat:      Pharynx: Oropharynx is clear.   Eyes:      Extraocular Movements:  Extraocular movements intact.      Pupils: Pupils are equal, round, and reactive to light.   Cardiovascular:      Rate and Rhythm: Normal rate and regular rhythm.      Pulses: Normal pulses.      Heart sounds: Normal heart sounds. No murmur heard.     No gallop.   Pulmonary:      Effort: Pulmonary effort is normal. No respiratory distress.      Breath sounds: Normal breath sounds.   Musculoskeletal:         General: Normal range of motion.      Cervical back: Normal range of motion.   Neurological:      General: No focal deficit present.      Mental Status: She is alert and oriented to person, place, and time. Mental status is at baseline.   Psychiatric:         Mood and Affect: Mood normal.         Behavior: Behavior normal.         Thought Content: Thought content normal.         Judgment: Judgment normal.           Component  Ref Range & Units (hover) 06/22/2023   HPV RNA PCR Negative     Component  Ref Range & Units (hover) 07/10/2023   NEISSERIA GONORRHEA GC RNA Negative     Pap Smear - 06/22/2023  A.  Cervical/Endocervical GYN Thin Prep ([facility])  Satisfactory for evaluation.  NEGATIVE FOR INTRAEPITHELIAL LESION OR MALIGNANCY  Epithelial atrophy.     Assessment & Plan:     ENCOUNTER DIAGNOSES     ICD-10-CM   1. Well adult exam  Z00.00   2. Other fatigue  R53.83     Annual Physical Examination    - Plan:    - Order complete blood count (CBC) and lipid panel    - Perform comprehensive physical examination    - Follow up in 6 months    Weight and Fatigue Management    - Plan:    - Recommend high-protein diet    - Advise minimizing starch intake    - Increase water intake to at least 8 glasses per day    - Discuss benefits of strength training for fat burning    - Educate on cardio exercises for fat burning during workouts    - Emphasize importance of adequate sleep    - Schedule follow-up appointment in 6 months      Orders Placed This Encounter    COMPREHENSIVE METABOLIC PANEL, NON-FASTING    CBC/DIFF    LIPID PANEL     THYROID  STIMULATING HORMONE WITH FREE T4 REFLEX       Return in about  6 months (around 01/19/2024), or f/u, for In Person Visit.    Portions of this note may be dictated using voice recognition software or a dictation service. Variances in spelling and vocabulary are possible and unintentional. Not all errors are caught/corrected. Please notify the [facility] if any discrepancies are noted, or if the meaning of any statement is not clear.     I am scribing for, and in the presence of, Dr. Janee Mech for services provided on 07/20/2023.  Essie Hefty, MA          [1]   Allergies  Allergen Reactions    Sulfa (Sulfonamides)    [2]   Social History  Tobacco Use    Smoking status: Never   Substance Use Topics    Alcohol use: Not Currently    Drug use: Never

## 2023-07-24 ENCOUNTER — Ambulatory Visit: Attending: Family Medicine

## 2023-07-24 ENCOUNTER — Other Ambulatory Visit: Payer: Self-pay

## 2023-07-24 ENCOUNTER — Ambulatory Visit (HOSPITAL_COMMUNITY)

## 2023-07-24 DIAGNOSIS — Z0000 Encounter for general adult medical examination without abnormal findings: Secondary | ICD-10-CM | POA: Insufficient documentation

## 2023-07-24 DIAGNOSIS — R5383 Other fatigue: Secondary | ICD-10-CM | POA: Insufficient documentation

## 2023-07-24 LAB — LIPID PANEL
CHOL/HDL RATIO: 2.7
CHOLESTEROL: 223 mg/dL — ABNORMAL HIGH (ref 100–200)
HDL CHOL: 82 mg/dL (ref >=50)
LDL CALC: 121 mg/dL — ABNORMAL HIGH (ref <100)
NON-HDL: 141 mg/dL (ref <=190)
TRIGLYCERIDES: 116 mg/dL (ref <150)
VLDL CALC: 20 mg/dL (ref <30)

## 2023-07-24 LAB — COMPREHENSIVE METABOLIC PANEL, NON-FASTING
ALBUMIN: 4 g/dL (ref 3.4–4.8)
ALKALINE PHOSPHATASE: 60 U/L (ref 50–130)
ALT (SGPT): 19 U/L (ref <31)
ANION GAP: 8 mmol/L (ref 4–13)
AST (SGOT): 29 U/L (ref 11–34)
BILIRUBIN TOTAL: 0.8 mg/dL (ref 0.3–1.3)
BUN/CREA RATIO: 18 (ref 6–22)
BUN: 13 mg/dL (ref 8–25)
CALCIUM: 9 mg/dL (ref 8.6–10.3)
CHLORIDE: 104 mmol/L (ref 96–111)
CO2 TOTAL: 27 mmol/L (ref 23–31)
CREATININE: 0.72 mg/dL (ref 0.60–1.05)
ESTIMATED GFR - FEMALE: >90 mL/min/BSA (ref >=60)
GLUCOSE: 98 mg/dL (ref 65–125)
POTASSIUM: 4.4 mmol/L (ref 3.5–5.1)
PROTEIN TOTAL: 7.1 g/dL (ref 5.6–7.6)
SODIUM: 139 mmol/L (ref 136–145)

## 2023-07-24 LAB — CBC WITH DIFF
BASOPHIL #: <0.1 10*3/uL (ref <=0.20)
BASOPHIL %: 1.6 %
EOSINOPHIL #: 0.1 10*3/uL (ref <=0.50)
EOSINOPHIL %: 2.1 %
HCT: 39.8 % (ref 34.8–46.0)
HGB: 12.5 g/dL (ref 11.5–16.0)
IMMATURE GRANULOCYTE #: <0.1 10*3/uL (ref <0.10)
IMMATURE GRANULOCYTE %: 0 % (ref 0.0–1.0)
LYMPHOCYTE #: 2.41 10*3/uL (ref 1.00–4.80)
LYMPHOCYTE %: 49.6 %
MCH: 28.9 pg (ref 26.0–32.0)
MCHC: 31.4 g/dL (ref 31.0–35.5)
MCV: 91.9 fL (ref 78.0–100.0)
MONOCYTE #: 0.4 10*3/uL (ref 0.20–1.10)
MONOCYTE %: 8.2 %
MPV: 9 fL (ref 8.7–12.5)
NEUTROPHIL #: 1.87 10*3/uL (ref 1.50–7.70)
NEUTROPHIL %: 38.5 %
PLATELETS: 329 10*3/uL (ref 150–400)
RBC: 4.33 10*6/uL (ref 3.85–5.22)
RDW-CV: 12 % (ref 11.5–15.5)
WBC: 4.9 10*3/uL (ref 3.7–11.0)

## 2023-07-24 LAB — THYROID STIMULATING HORMONE WITH FREE T4 REFLEX: TSH: 0.768 u[IU]/mL (ref 0.350–4.940)

## 2023-08-08 ENCOUNTER — Other Ambulatory Visit: Payer: Self-pay

## 2023-08-08 ENCOUNTER — Ambulatory Visit
Admission: RE | Admit: 2023-08-08 | Discharge: 2023-08-08 | Disposition: A | Discharge status: Home / Self Care | Source: Ambulatory Visit | Attending: Family Medicine | Admitting: Family Medicine

## 2023-08-08 DIAGNOSIS — Z1231 Encounter for screening mammogram for malignant neoplasm of breast: Secondary | ICD-10-CM | POA: Insufficient documentation

## 2024-01-22 ENCOUNTER — Encounter (INDEPENDENT_AMBULATORY_CARE_PROVIDER_SITE_OTHER): Payer: Self-pay | Admitting: Family Medicine
